# Patient Record
Sex: Male | Born: 1975 | Race: White | Hispanic: No | Marital: Single | Smoking: Never smoker
Health system: Southern US, Community
[De-identification: ages and names within clinical notes are randomized; demographics above are authoritative.]

## PROBLEM LIST (undated history)

## (undated) DIAGNOSIS — I82409 Acute embolism and thrombosis of unspecified deep veins of unspecified lower extremity: Secondary | ICD-10-CM

## (undated) DIAGNOSIS — R569 Unspecified convulsions: Secondary | ICD-10-CM

## (undated) DIAGNOSIS — G40909 Epilepsy, unspecified, not intractable, without status epilepticus: Secondary | ICD-10-CM

## (undated) HISTORY — DX: Epilepsy, unspecified, not intractable, without status epilepticus: G40.909

## (undated) HISTORY — DX: Acute embolism and thrombosis of unspecified deep veins of unspecified lower extremity: I82.409

## (undated) HISTORY — DX: Unspecified convulsions: R56.9

---

## 2021-05-19 ENCOUNTER — Encounter (HOSPITAL_COMMUNITY): Payer: Self-pay | Admitting: Pulmonary Disease

## 2021-05-19 ENCOUNTER — Inpatient Hospital Stay (HOSPITAL_COMMUNITY): Payer: Medicaid - Out of State

## 2021-05-19 ENCOUNTER — Inpatient Hospital Stay (HOSPITAL_COMMUNITY)
Admission: AD | Admit: 2021-05-19 | Discharge: 2021-05-26 | DRG: 175 | Disposition: A | Discharge status: Home Health | Payer: Medicaid - Out of State | Source: Other Acute Inpatient Hospital | Attending: Internal Medicine | Admitting: Internal Medicine

## 2021-05-19 DIAGNOSIS — I2609 Other pulmonary embolism with acute cor pulmonale: Principal | ICD-10-CM | POA: Diagnosis present

## 2021-05-19 DIAGNOSIS — G119 Hereditary ataxia, unspecified: Secondary | ICD-10-CM

## 2021-05-19 DIAGNOSIS — K921 Melena: Secondary | ICD-10-CM | POA: Diagnosis present

## 2021-05-19 DIAGNOSIS — R778 Other specified abnormalities of plasma proteins: Secondary | ICD-10-CM | POA: Diagnosis present

## 2021-05-19 DIAGNOSIS — K922 Gastrointestinal hemorrhage, unspecified: Secondary | ICD-10-CM

## 2021-05-19 DIAGNOSIS — R202 Paresthesia of skin: Secondary | ICD-10-CM

## 2021-05-19 DIAGNOSIS — I10 Essential (primary) hypertension: Secondary | ICD-10-CM | POA: Diagnosis present

## 2021-05-19 DIAGNOSIS — I2699 Other pulmonary embolism without acute cor pulmonale: Principal | ICD-10-CM | POA: Diagnosis present

## 2021-05-19 DIAGNOSIS — D62 Acute posthemorrhagic anemia: Secondary | ICD-10-CM | POA: Diagnosis present

## 2021-05-19 DIAGNOSIS — M109 Gout, unspecified: Secondary | ICD-10-CM | POA: Diagnosis present

## 2021-05-19 DIAGNOSIS — R319 Hematuria, unspecified: Secondary | ICD-10-CM | POA: Diagnosis not present

## 2021-05-19 DIAGNOSIS — Z8739 Personal history of other diseases of the musculoskeletal system and connective tissue: Secondary | ICD-10-CM

## 2021-05-19 DIAGNOSIS — R531 Weakness: Secondary | ICD-10-CM

## 2021-05-19 DIAGNOSIS — Z531 Procedure and treatment not carried out because of patient's decision for reasons of belief and group pressure: Secondary | ICD-10-CM | POA: Diagnosis not present

## 2021-05-19 DIAGNOSIS — I2782 Chronic pulmonary embolism: Secondary | ICD-10-CM | POA: Diagnosis not present

## 2021-05-19 DIAGNOSIS — I70222 Atherosclerosis of native arteries of extremities with rest pain, left leg: Secondary | ICD-10-CM

## 2021-05-19 DIAGNOSIS — I82433 Acute embolism and thrombosis of popliteal vein, bilateral: Secondary | ICD-10-CM | POA: Diagnosis present

## 2021-05-19 DIAGNOSIS — Z91199 Patient's noncompliance with other medical treatment and regimen due to unspecified reason: Secondary | ICD-10-CM | POA: Diagnosis not present

## 2021-05-19 DIAGNOSIS — G40909 Epilepsy, unspecified, not intractable, without status epilepticus: Secondary | ICD-10-CM | POA: Diagnosis present

## 2021-05-19 DIAGNOSIS — M79602 Pain in left arm: Secondary | ICD-10-CM | POA: Diagnosis not present

## 2021-05-19 DIAGNOSIS — J9601 Acute respiratory failure with hypoxia: Secondary | ICD-10-CM | POA: Diagnosis present

## 2021-05-19 DIAGNOSIS — R579 Shock, unspecified: Secondary | ICD-10-CM | POA: Diagnosis present

## 2021-05-19 DIAGNOSIS — R270 Ataxia, unspecified: Secondary | ICD-10-CM | POA: Diagnosis present

## 2021-05-19 DIAGNOSIS — D509 Iron deficiency anemia, unspecified: Secondary | ICD-10-CM | POA: Diagnosis present

## 2021-05-19 DIAGNOSIS — E039 Hypothyroidism, unspecified: Secondary | ICD-10-CM | POA: Diagnosis present

## 2021-05-19 DIAGNOSIS — F1090 Alcohol use, unspecified, uncomplicated: Secondary | ICD-10-CM | POA: Diagnosis present

## 2021-05-19 DIAGNOSIS — E785 Hyperlipidemia, unspecified: Secondary | ICD-10-CM | POA: Diagnosis present

## 2021-05-19 DIAGNOSIS — G8929 Other chronic pain: Secondary | ICD-10-CM | POA: Diagnosis not present

## 2021-05-19 HISTORY — PX: IR IVC FILTER PLMT / S&I /IMG GUID/MOD SED: IMG701

## 2021-05-19 LAB — BASIC METABOLIC PANEL
Anion gap: 9 (ref 5–15)
Anion gap: 6 (ref 5–15)
BUN: 22 mg/dL — ABNORMAL HIGH (ref 6–20)
BUN: 22 mg/dL — ABNORMAL HIGH (ref 6–20)
CO2: 22 mmol/L (ref 22–32)
CO2: 24 mmol/L (ref 22–32)
Calcium: 8.5 mg/dL — ABNORMAL LOW (ref 8.9–10.3)
Calcium: 8.4 mg/dL — ABNORMAL LOW (ref 8.9–10.3)
Chloride: 107 mmol/L (ref 98–111)
Chloride: 109 mmol/L (ref 98–111)
Creatinine, Ser: 1.07 mg/dL (ref 0.61–1.24)
Creatinine, Ser: 1.02 mg/dL (ref 0.61–1.24)
GFR, Estimated: >60 mL/min (ref >60)
GFR, Estimated: >60 mL/min (ref >60)
Glucose, Bld: 112 mg/dL — ABNORMAL HIGH (ref 70–99)
Glucose, Bld: 104 mg/dL — ABNORMAL HIGH (ref 70–99)
Potassium: 3.8 mmol/L (ref 3.5–5.1)
Potassium: 3.5 mmol/L (ref 3.5–5.1)
Sodium: 138 mmol/L (ref 135–145)
Sodium: 139 mmol/L (ref 135–145)

## 2021-05-19 LAB — COMPREHENSIVE METABOLIC PANEL
ALT: 15 U/L (ref 0–44)
AST: 27 U/L (ref 15–41)
Albumin: 3 g/dL — ABNORMAL LOW (ref 3.5–5.0)
Alkaline Phosphatase: 64 U/L (ref 38–126)
Anion gap: 10 (ref 5–15)
BUN: 24 mg/dL — ABNORMAL HIGH (ref 6–20)
CO2: 22 mmol/L (ref 22–32)
Calcium: 8.6 mg/dL — ABNORMAL LOW (ref 8.9–10.3)
Chloride: 108 mmol/L (ref 98–111)
Creatinine, Ser: 1.16 mg/dL (ref 0.61–1.24)
GFR, Estimated: >60 mL/min (ref >60)
Glucose, Bld: 143 mg/dL — ABNORMAL HIGH (ref 70–99)
Potassium: 3.1 mmol/L — ABNORMAL LOW (ref 3.5–5.1)
Sodium: 140 mmol/L (ref 135–145)
Total Bilirubin: 0.6 mg/dL (ref 0.3–1.2)
Total Protein: 6.2 g/dL — ABNORMAL LOW (ref 6.5–8.1)

## 2021-05-19 LAB — CBC WITH DIFFERENTIAL/PLATELET
Abs Immature Granulocytes: 0.03 10*3/uL (ref 0.00–0.07)
Basophils Absolute: 0.1 10*3/uL (ref 0.0–0.1)
Basophils Relative: 1 %
Eosinophils Absolute: 0 10*3/uL (ref 0.0–0.5)
Eosinophils Relative: 0 %
HCT: 24 % — ABNORMAL LOW (ref 39.0–52.0)
Hemoglobin: 7.1 g/dL — ABNORMAL LOW (ref 13.0–17.0)
Immature Granulocytes: 0 %
Lymphocytes Relative: 28 %
Lymphs Abs: 2.8 10*3/uL (ref 0.7–4.0)
MCH: 21.4 pg — ABNORMAL LOW (ref 26.0–34.0)
MCHC: 29.6 g/dL — ABNORMAL LOW (ref 30.0–36.0)
MCV: 72.3 fL — ABNORMAL LOW (ref 80.0–100.0)
Monocytes Absolute: 1.2 10*3/uL — ABNORMAL HIGH (ref 0.1–1.0)
Monocytes Relative: 12 %
Neutro Abs: 6 10*3/uL (ref 1.7–7.7)
Neutrophils Relative %: 59 %
Platelets: 173 10*3/uL (ref 150–400)
RBC: 3.32 MIL/uL — ABNORMAL LOW (ref 4.22–5.81)
RDW: 15.5 % (ref 11.5–15.5)
WBC: 10.1 10*3/uL (ref 4.0–10.5)
nRBC: 0.5 % — ABNORMAL HIGH (ref 0.0–0.2)

## 2021-05-19 LAB — CBC
HCT: 22.5 % — ABNORMAL LOW (ref 39.0–52.0)
Hemoglobin: 6.5 g/dL — CL (ref 13.0–17.0)
MCH: 21 pg — ABNORMAL LOW (ref 26.0–34.0)
MCHC: 28.9 g/dL — ABNORMAL LOW (ref 30.0–36.0)
MCV: 72.6 fL — ABNORMAL LOW (ref 80.0–100.0)
Platelets: 147 10*3/uL — ABNORMAL LOW (ref 150–400)
RBC: 3.1 MIL/uL — ABNORMAL LOW (ref 4.22–5.81)
RDW: 15.8 % — ABNORMAL HIGH (ref 11.5–15.5)
WBC: 8.6 10*3/uL (ref 4.0–10.5)
nRBC: 0.2 % (ref 0.0–0.2)

## 2021-05-19 LAB — MRSA NEXT GEN BY PCR, NASAL: MRSA by PCR Next Gen: NOT DETECTED

## 2021-05-19 LAB — ECHOCARDIOGRAM COMPLETE
AR max vel: 3.31 cm2
AV Area VTI: 3.36 cm2
AV Area mean vel: 3.11 cm2
AV Mean grad: 5 mmHg
AV Peak grad: 8.6 mmHg
Ao pk vel: 1.47 m/s
Area-P 1/2: 3.48 cm2
Calc EF: 67.3 %
S' Lateral: 2.4 cm
Single Plane A2C EF: 57.1 %
Single Plane A4C EF: 74.7 %
Weight: 2881.85 oz

## 2021-05-19 LAB — APTT: aPTT: 78 seconds — ABNORMAL HIGH (ref 24–36)

## 2021-05-19 LAB — IRON AND TIBC
Iron: 14 ug/dL — ABNORMAL LOW (ref 45–182)
Saturation Ratios: 3 % — ABNORMAL LOW (ref 17.9–39.5)
TIBC: 417 ug/dL (ref 250–450)
UIBC: 403 ug/dL

## 2021-05-19 LAB — GLUCOSE, CAPILLARY
Glucose-Capillary: 109 mg/dL — ABNORMAL HIGH (ref 70–99)
Glucose-Capillary: 110 mg/dL — ABNORMAL HIGH (ref 70–99)
Glucose-Capillary: 111 mg/dL — ABNORMAL HIGH (ref 70–99)
Glucose-Capillary: 122 mg/dL — ABNORMAL HIGH (ref 70–99)
Glucose-Capillary: 83 mg/dL (ref 70–99)
Glucose-Capillary: 89 mg/dL (ref 70–99)
Glucose-Capillary: 98 mg/dL (ref 70–99)

## 2021-05-19 LAB — PHOSPHORUS
Phosphorus: 2.2 mg/dL — ABNORMAL LOW (ref 2.5–4.6)
Phosphorus: 2.8 mg/dL (ref 2.5–4.6)

## 2021-05-19 LAB — TROPONIN I (HIGH SENSITIVITY)
Troponin I (High Sensitivity): 109 ng/L (ref <18)
Troponin I (High Sensitivity): 114 ng/L (ref <18)

## 2021-05-19 LAB — NO BLOOD PRODUCTS

## 2021-05-19 LAB — HEMOGLOBIN AND HEMATOCRIT, BLOOD
HCT: 22.3 % — ABNORMAL LOW (ref 39.0–52.0)
Hemoglobin: 6.2 g/dL — CL (ref 13.0–17.0)

## 2021-05-19 LAB — MAGNESIUM
Magnesium: 1.6 mg/dL — ABNORMAL LOW (ref 1.7–2.4)
Magnesium: 1.8 mg/dL (ref 1.7–2.4)

## 2021-05-19 LAB — FOLATE: Folate: 20.6 ng/mL (ref >5.9)

## 2021-05-19 LAB — ABO/RH: ABO/RH(D): O POS

## 2021-05-19 LAB — BRAIN NATRIURETIC PEPTIDE: B Natriuretic Peptide: 1082 pg/mL — ABNORMAL HIGH (ref 0.0–100.0)

## 2021-05-19 LAB — LACTIC ACID, PLASMA
Lactic Acid, Venous: 1.3 mmol/L (ref 0.5–1.9)
Lactic Acid, Venous: 1.1 mmol/L (ref 0.5–1.9)

## 2021-05-19 LAB — HEPARIN LEVEL (UNFRACTIONATED)
Heparin Unfractionated: 0.41 IU/mL (ref 0.30–0.70)
Heparin Unfractionated: 0.62 IU/mL (ref 0.30–0.70)
Heparin Unfractionated: 0.42 IU/mL (ref 0.30–0.70)

## 2021-05-19 LAB — PROTIME-INR
INR: 1.1 (ref 0.8–1.2)
Prothrombin Time: 14.4 seconds (ref 11.4–15.2)

## 2021-05-19 LAB — HIV ANTIBODY (ROUTINE TESTING W REFLEX): HIV Screen 4th Generation wRfx: NONREACTIVE

## 2021-05-19 LAB — FERRITIN: Ferritin: 17 ng/mL — ABNORMAL LOW (ref 24–336)

## 2021-05-19 LAB — VITAMIN B12: Vitamin B-12: 497 pg/mL (ref 180–914)

## 2021-05-19 IMAGING — CT CT ANGIO AOBIFEM WO/W CM
1 of 11 series · 3 of 16 positions shown, 4 images · IV contrast (OMNI 350)
Comparison: None.

CLINICAL DATA: Arterial embolism, lower extremity

EXAM:
CT ANGIOGRAPHY OF ABDOMINAL AORTA WITH ILIOFEMORAL RUNOFF
TECHNIQUE: Multidetector CT imaging of the abdomen, pelvis and lower
extremities was performed using the standard protocol during bolus
administration of intravenous contrast. Multiplanar CT image
reconstructions and MIPs were obtained to evaluate the vascular
anatomy.

[Series 5: cta runoff (id) · axial · 0.98mm/px · z∈[+433,+1171]mm · 3 of 492 slices shown, 4 images]
[im 123/492  soft-tissue]
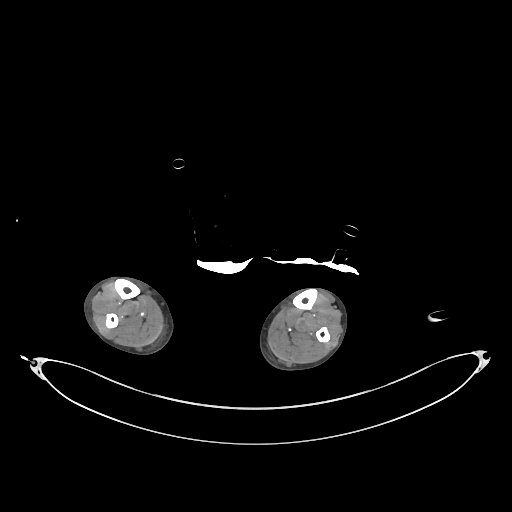
[im 123/492  bone]
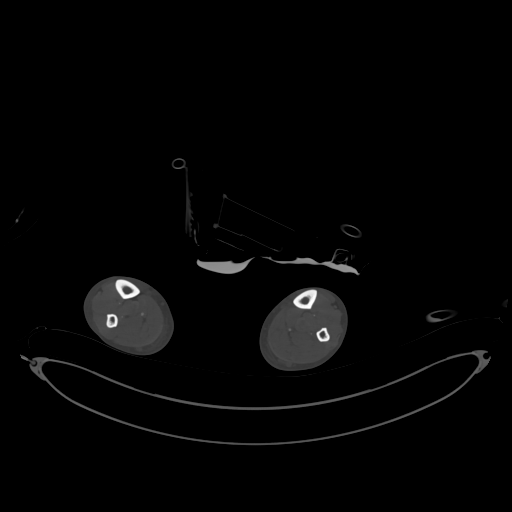
[im 246/492  soft-tissue]
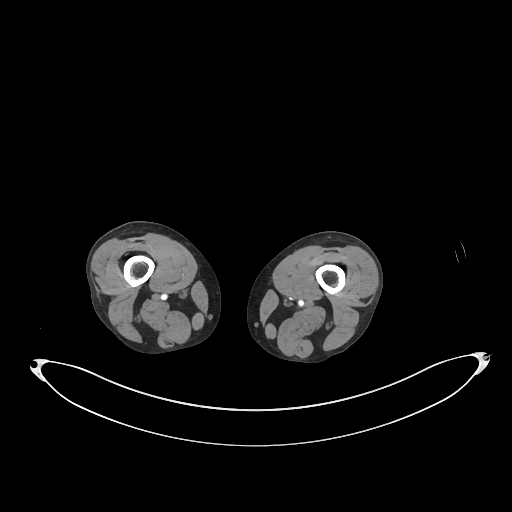
[im 369/492  soft-tissue]
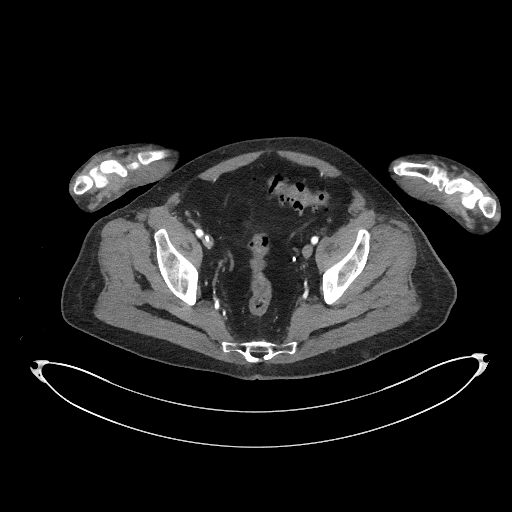

[3 of 16 positions shown; findings below may reference images not displayed]

RADIATION DOSE REDUCTION: This exam was performed according to the
departmental dose-optimization program which includes automated
exposure control, adjustment of the mA and/or kV according to
patient size and/or use of iterative reconstruction technique.

CONTRAST:  100mL OMNIPAQUE IOHEXOL 350 MG/ML SOLN
FINDINGS: VASCULAR

Abdominal Aorta: The abdominal aorta is normal in caliber with
scattered atherosclerotic disease.

Celiac: Patent without significant stenosis.

SMA: Patent without significant stenosis.

IMA: Patent.

Right Renal artery: Patent without significant stenosis.

Left Renal artery: Patent without significant stenosis.

Right lower extremity

Common iliac artery: Patent without significant stenosis or
aneurysm.

Internal iliac artery: Patent.

External iliac artery: Patent without significant stenosis.

Common femoral artery: Patent without significant stenosis.

Profunda femoral artery: There is occlusive thrombus involving a
proximal branch of the profunda artery.

Superficial femoral artery: Patent without significant stenosis.

Popliteal artery: Patent without significant stenosis or aneurysm.

Tibioperoneal trunk: Mild atherosclerotic disease without
significant stenosis.

Runoff vessels: Patent three-vessel runoff to the ankle.

Left lower extremity

Common iliac artery: Patent without significant stenosis or
aneurysm.

Internal iliac artery: Patent.

External iliac artery: Patent without significant stenosis.

Common femoral artery: Patent without significant stenosis.

Profunda femoral artery: There is occlusive thrombus involving a
branch of the profunda artery.

Superficial femoral artery: Patent without significant stenosis.

Popliteal artery: There is occlusive thrombus of the popliteal
artery spanning approximately 9 cm.

Tibioperoneal trunk: Occlusive thrombus.

Runoff vessels: Occluded proximally. There is reconstitution of a
three-vessel runoff in the proximal calf that remains patent to the
level of the ankle.

Veins: Infrarenal IVC filter in place. Right femoral vein central
venous catheter with tip terminating in the right iliac veins.

NON-VASCULAR

Inferior chest: Known bilateral pulmonary embolism.

Hepatobiliary: The liver is normal in size without focal
abnormality. No intrahepatic or extrahepatic biliary ductal
dilation. The gallbladder appears normal.

Spleen: Normal in size without focal abnormality.

Pancreas: No pancreatic ductal dilatation or surrounding
inflammatory changes.

Adrenals/Urinary Tract: Adrenal glands are unremarkable. Kidneys are
normal in size without hydronephrosis. Small right renal cyst. Foley
catheter in place.

Stomach/Bowel: The stomach, small bowel and large bowel are normal
in caliber without abnormal wall thickening or surrounding
inflammatory changes. Sigmoid diverticulosis without acute
inflammatory changes.

Reproductive: Prostate is unremarkable.

Lymphatic: No enlarged lymph nodes in the abdomen or pelvis.

Other: No abdominopelvic ascites.

Musculoskeletal: Healing left posterior tenth rib fracture. Left
ankle internal fixation hardware. The soft tissues are unremarkable.
IMPRESSION: VASCULAR

1. There is acute occlusive thromboembolism involving the left
popliteal artery and proximal runoff vessels (see key images). There
is reconstitution of the three-vessel runoff in the proximal calf
that is patent through the ankle.
2. There is additional acute thrombus involving bilateral profunda
artery branches.
3. Known bilateral pulmonary embolism.
4. IVC filter and left groin central venous catheter in place.

NON-VASCULAR

1. Sigmoid diverticulosis without acute inflammatory changes.
2. Healing left posterior tenth rib fracture.

The critical results were called by telephone at the time of
interpretation on [DATE] at [DATE] to provider JAYLON , who
verbally acknowledged these results.

## 2021-05-19 IMAGING — XA IR IVC FILTER PLMT / S&I /IMG GUID/MOD SED
2 series · 11 of 11 positions shown · IV contrast (IODINE)
Comparison: Chest CTA-earlier same day

INDICATION: DVT and pulmonary embolism with contraindication to anticoagulation
(lower GI bleeding).

Given patient's multiple medical comorbidities the patient will NOT
be actively followed by the [REDACTED] for
retrieval.
EXAM:
ULTRASOUND GUIDANCE FOR VASCULAR ACCESS
IVC CATHETERIZATION AND VENOGRAM
IVC FILTER INSERTION

[Series 1: body 4 care · 3 acquisitions, 6 frames shown (1 of 2)]
[im 1/3]
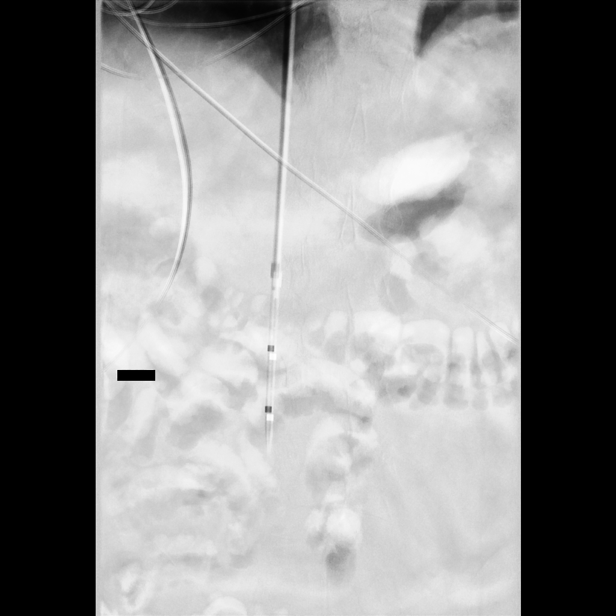
[im 1/3]
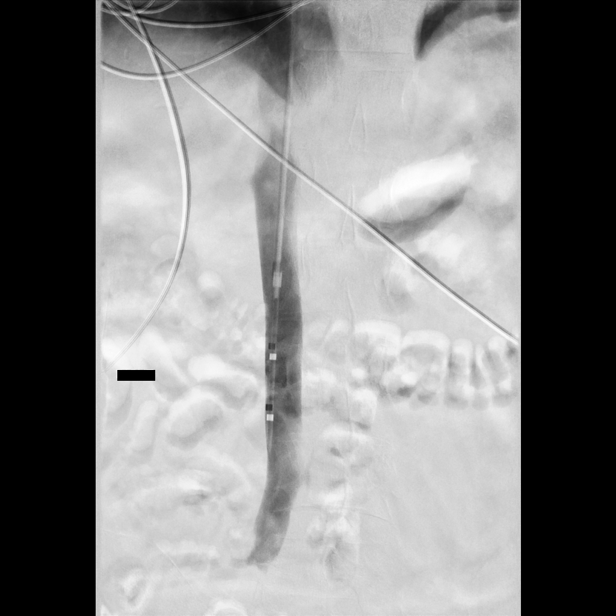
[im 1/3]
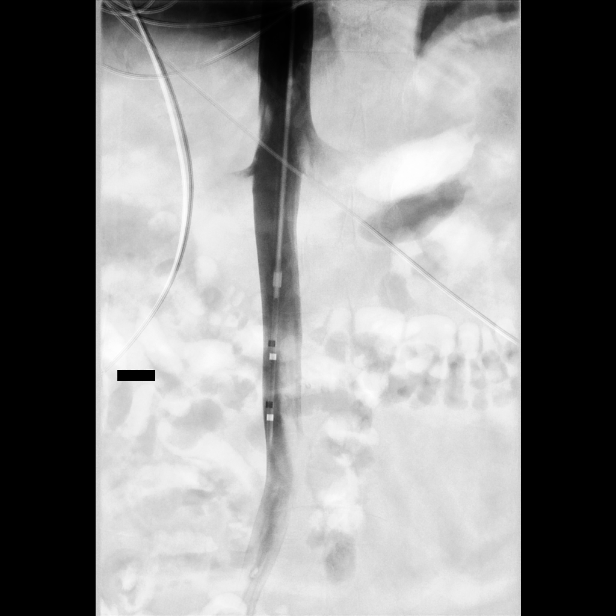
[im 1/3]
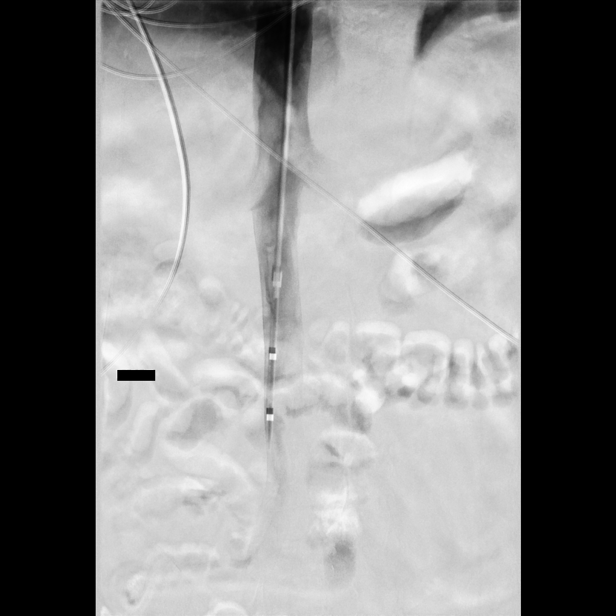
[im 2/3]
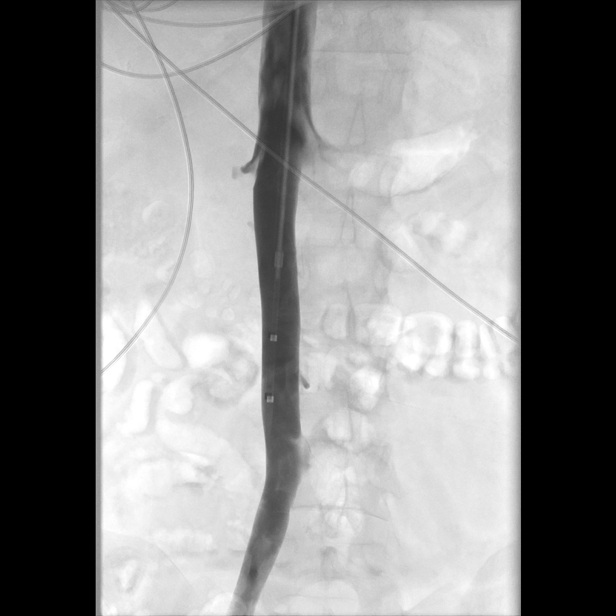
[im 3/3]
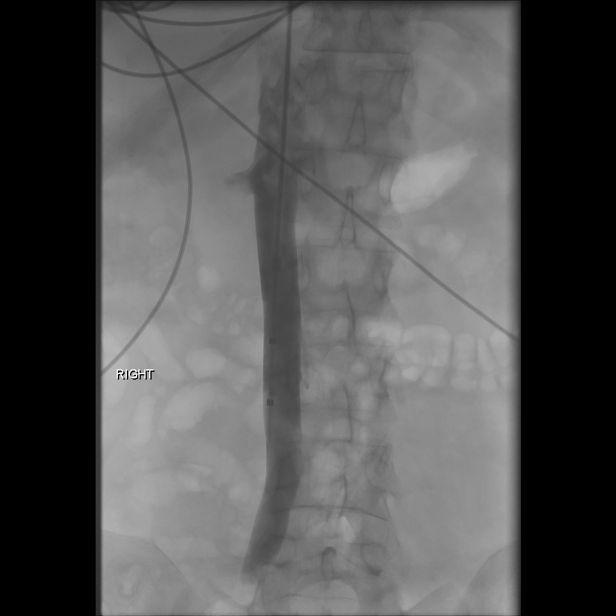

[Series 2: body 4 care · 2 acquisitions, 5 frames shown (2 of 2)]
[im 1/2]
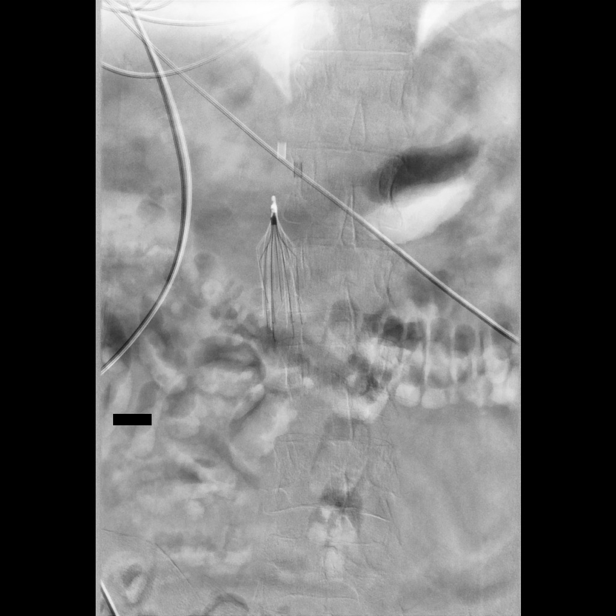
[im 1/2]
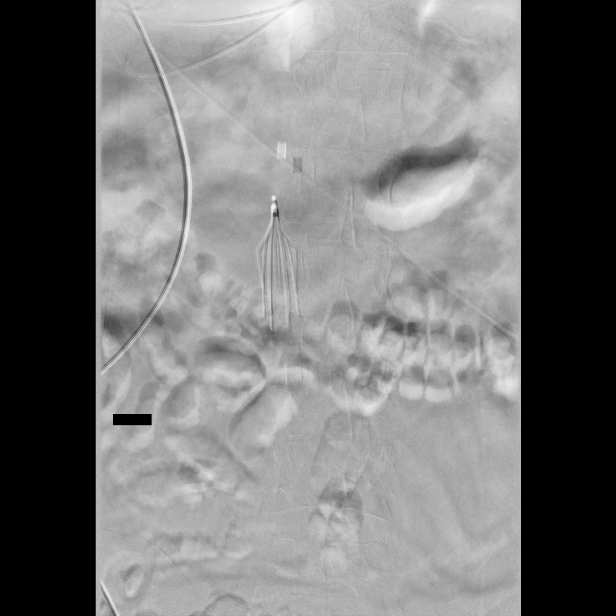
[im 1/2]
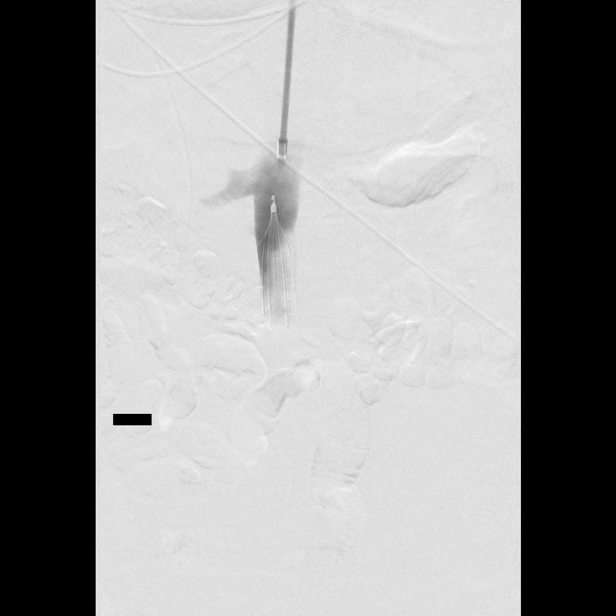
[im 1/2]
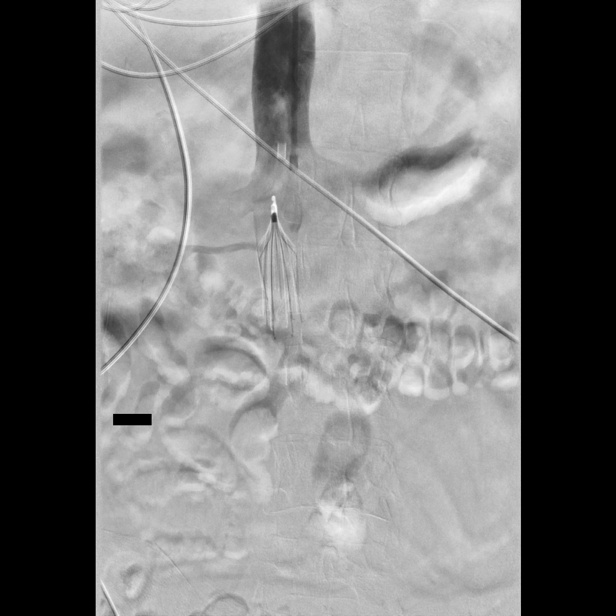
[im 2/2]
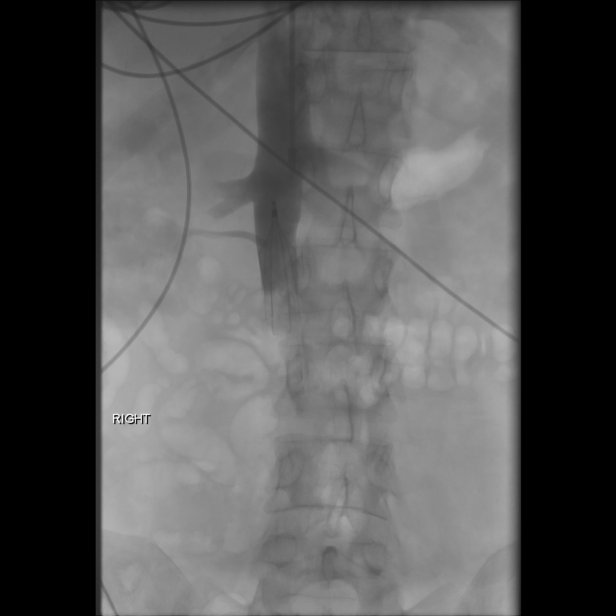

[11 of 11 positions shown; findings below may reference images not displayed]

MEDICATIONS:
None.

ANESTHESIA/SEDATION:
Moderate (conscious) sedation was employed during this procedure as
administered by the [REDACTED].

A total of Versed 1 mg and Fentanyl 25 mcg was administered
intravenously.

Moderate Sedation Time: 11 minutes. The patient's level of
consciousness and vital signs were monitored continuously by
radiology nursing throughout the procedure under my direct
supervision.

CONTRAST:  45 mL OMNIPAQUE IOHEXOL 300 MG/ML  SOLN

FLUOROSCOPY TIME:  48 seconds (32 mGy)

COMPLICATIONS:
None immediate

PROCEDURE:
Informed consent was obtained from the patient following explanation
of the procedure, risks, benefits and alternatives. The patient
understands, agrees and consents for the procedure. All questions
were addressed. A time out was performed prior to the initiation of
the procedure.

Maximal barrier sterile technique utilized including caps, mask,
sterile gowns, sterile gloves, large sterile drape, hand hygiene,
and Betadine prep.

Under sterile condition and local anesthesia, right internal jugular
venous access was performed with ultrasound. An ultrasound image was
saved and sent to PACS. Over a guidewire, the IVC filter delivery
sheath and inner dilator were advanced into the IVC just above the
IVC bifurcation. Contrast injection was performed for an IVC
venogram.

Through the delivery sheath, a retrievable Denali IVC filter was
deployed below the level of the renal veins and above the IVC
bifurcation. Limited post deployment venacavagram was performed.

The delivery sheath was removed and hemostasis was obtained with
manual compression. A dressing was placed. The patient tolerated the
procedure well without immediate post procedural complication.
FINDINGS: The IVC is patent. No evidence of thrombus, stenosis, or occlusion.
No variant venous anatomy. Successful placement of the IVC filter
below the level of the renal veins.
IMPRESSION: Successful ultrasound and fluoroscopically guided placement of an
infrarenal retrievable IVC filter via right jugular approach.

PLAN:
Due to patient related comorbidities and/or clinical necessity, this
IVC filter should be considered a permanent device. This patient
will not be actively followed for future filter retrieval.

## 2021-05-19 MED ORDER — MIDAZOLAM HCL 2 MG/2ML IJ SOLN
INTRAMUSCULAR | Status: AC
  Filled 2021-05-19: qty 2

## 2021-05-19 MED ORDER — SODIUM CHLORIDE 0.9 % IV SOLN
250.0000 mL | INTRAVENOUS | Status: DC
  Administered 2021-05-19 – 2021-05-20 (×2): 250 mL via INTRAVENOUS

## 2021-05-19 MED ORDER — SODIUM CHLORIDE 0.9 % IV SOLN
1.0000 mg | Freq: Once | INTRAVENOUS | Status: AC
  Administered 2021-05-19: 1 mg via INTRAVENOUS
  Filled 2021-05-19: qty 0.2

## 2021-05-19 MED ORDER — PANTOPRAZOLE SODIUM 40 MG PO TBEC: 40.0000 mg | DELAYED_RELEASE_TABLET | Freq: Every day | ORAL | Status: DC

## 2021-05-19 MED ORDER — NOREPINEPHRINE 16 MG/250ML-% IV SOLN
0.0000 ug/min | INTRAVENOUS | Status: DC
  Administered 2021-05-19: 3 ug/min via INTRAVENOUS
  Filled 2021-05-19: qty 250

## 2021-05-19 MED ORDER — POTASSIUM CHLORIDE 10 MEQ/50ML IV SOLN
10.0000 meq | INTRAVENOUS | Status: AC
  Administered 2021-05-19 (×3): 10 meq via INTRAVENOUS
  Filled 2021-05-19 (×3): qty 50

## 2021-05-19 MED ORDER — CYANOCOBALAMIN 1000 MCG/ML IJ SOLN
1000.0000 ug | Freq: Once | INTRAMUSCULAR | Status: AC
  Administered 2021-05-19: 1000 ug via INTRAMUSCULAR
  Filled 2021-05-19: qty 1

## 2021-05-19 MED ORDER — PANTOPRAZOLE SODIUM 40 MG IV SOLR
40.0000 mg | Freq: Two times a day (BID) | INTRAVENOUS | Status: DC
  Administered 2021-05-19 – 2021-05-20 (×3): 40 mg via INTRAVENOUS
  Filled 2021-05-19 (×3): qty 10

## 2021-05-19 MED ORDER — "THROMBI-PAD 3""X3"" EX PADS"
1.0000 | MEDICATED_PAD | Freq: Once | CUTANEOUS | Status: AC
  Administered 2021-05-19: 1 via TOPICAL
  Filled 2021-05-19: qty 1

## 2021-05-19 MED ORDER — LIDOCAINE HCL 1 % IJ SOLN
INTRAMUSCULAR | Status: AC
  Filled 2021-05-19: qty 20

## 2021-05-19 MED ORDER — MIRTAZAPINE 15 MG PO TABS: 15.0000 mg | ORAL_TABLET | Freq: Every day | ORAL | Status: DC

## 2021-05-19 MED ORDER — OXCARBAZEPINE 300 MG PO TABS
600.0000 mg | ORAL_TABLET | Freq: Two times a day (BID) | ORAL | Status: DC
  Administered 2021-05-19 – 2021-05-20 (×4): 600 mg via ORAL
  Filled 2021-05-19 (×4): qty 2

## 2021-05-19 MED ORDER — ALLOPURINOL 300 MG PO TABS
300.0000 mg | ORAL_TABLET | Freq: Every day | ORAL | Status: DC
  Administered 2021-05-20: 300 mg via ORAL
  Filled 2021-05-19: qty 1

## 2021-05-19 MED ORDER — PHENYLEPHRINE CONCENTRATED 100MG/250ML (0.4 MG/ML) INFUSION SIMPLE
25.0000 ug/min | INTRAVENOUS | Status: DC
  Filled 2021-05-19: qty 250

## 2021-05-19 MED ORDER — DOCUSATE SODIUM 100 MG PO CAPS: 100.0000 mg | ORAL_CAPSULE | Freq: Two times a day (BID) | ORAL | Status: DC | PRN

## 2021-05-19 MED ORDER — SODIUM CHLORIDE 0.9 % IV SOLN
1000.0000 mg | Freq: Once | INTRAVENOUS | Status: AC
  Administered 2021-05-19: 1000 mg via INTRAVENOUS
  Filled 2021-05-19: qty 20

## 2021-05-19 MED ORDER — ASCORBIC ACID 500 MG PO TABS
500.0000 mg | ORAL_TABLET | Freq: Every day | ORAL | Status: DC
  Administered 2021-05-19 – 2021-05-26 (×8): 500 mg via ORAL
  Filled 2021-05-19 (×9): qty 1

## 2021-05-19 MED ORDER — HEPARIN (PORCINE) 25000 UT/250ML-% IV SOLN
1150.0000 [IU]/h | INTRAVENOUS | Status: AC
  Administered 2021-05-19: 1500 [IU]/h via INTRAVENOUS
  Administered 2021-05-19: 1350 [IU]/h via INTRAVENOUS
  Administered 2021-05-20 – 2021-05-21 (×2): 1150 [IU]/h via INTRAVENOUS
  Filled 2021-05-19 (×4): qty 250

## 2021-05-19 MED ORDER — NOREPINEPHRINE 4 MG/250ML-% IV SOLN
INTRAVENOUS | Status: AC
  Administered 2021-05-19: 20 ug/min
  Filled 2021-05-19: qty 250

## 2021-05-19 MED ORDER — MIDAZOLAM HCL 2 MG/2ML IJ SOLN
INTRAMUSCULAR | Status: AC | PRN
Start: 2021-05-19 — End: 2021-05-19
  Administered 2021-05-19: 1 mg via INTRAVENOUS

## 2021-05-19 MED ORDER — ORAL CARE MOUTH RINSE
15.0000 mL | Freq: Two times a day (BID) | OROMUCOSAL | Status: DC
  Administered 2021-05-19 – 2021-05-26 (×13): 15 mL via OROMUCOSAL

## 2021-05-19 MED ORDER — IOHEXOL 300 MG/ML  SOLN
100.0000 mL | Freq: Once | INTRAMUSCULAR | Status: AC | PRN
  Administered 2021-05-19: 45 mL via INTRAVENOUS

## 2021-05-19 MED ORDER — POTASSIUM CHLORIDE 10 MEQ/50ML IV SOLN
10.0000 meq | Freq: Once | INTRAVENOUS | Status: AC
  Administered 2021-05-19: 10 meq via INTRAVENOUS
  Filled 2021-05-19: qty 50

## 2021-05-19 MED ORDER — SODIUM CHLORIDE 0.9 % IV SOLN
25.0000 mg | Freq: Once | INTRAVENOUS | Status: AC
  Administered 2021-05-19: 25 mg via INTRAVENOUS
  Filled 2021-05-19: qty 0.5

## 2021-05-19 MED ORDER — SODIUM CHLORIDE 0.9% IV SOLUTION: Freq: Once | INTRAVENOUS | Status: DC

## 2021-05-19 MED ORDER — MAGNESIUM SULFATE 4 GM/100ML IV SOLN
4.0000 g | Freq: Once | INTRAVENOUS | Status: AC
  Administered 2021-05-19: 4 g via INTRAVENOUS
  Filled 2021-05-19: qty 100

## 2021-05-19 MED ORDER — DARBEPOETIN ALFA 150 MCG/0.3ML IJ SOSY
150.0000 ug | PREFILLED_SYRINGE | Freq: Once | INTRAMUSCULAR | Status: DC
  Filled 2021-05-19: qty 0.3

## 2021-05-19 MED ORDER — FENTANYL CITRATE (PF) 100 MCG/2ML IJ SOLN
INTRAMUSCULAR | Status: AC | PRN
  Administered 2021-05-19: 25 ug via INTRAVENOUS

## 2021-05-19 MED ORDER — POLYETHYLENE GLYCOL 3350 17 G PO PACK: 17.0000 g | PACK | Freq: Every day | ORAL | Status: DC | PRN

## 2021-05-19 MED ORDER — CHLORHEXIDINE GLUCONATE CLOTH 2 % EX PADS
6.0000 | MEDICATED_PAD | Freq: Every day | CUTANEOUS | Status: DC
  Administered 2021-05-19 – 2021-05-25 (×4): 6 via TOPICAL

## 2021-05-19 MED ORDER — IOHEXOL 350 MG/ML SOLN
100.0000 mL | Freq: Once | INTRAVENOUS | Status: AC | PRN
  Administered 2021-05-19: 100 mL via INTRAVENOUS

## 2021-05-19 MED ORDER — POTASSIUM PHOSPHATES 15 MMOLE/5ML IV SOLN
15.0000 mmol | Freq: Once | INTRAVENOUS | Status: AC
  Administered 2021-05-19: 15 mmol via INTRAVENOUS
  Filled 2021-05-19: qty 5

## 2021-05-19 MED ORDER — DARBEPOETIN ALFA 150 MCG/0.3ML IJ SOSY
150.0000 ug | PREFILLED_SYRINGE | Freq: Once | INTRAMUSCULAR | Status: AC
  Administered 2021-05-19: 150 ug via SUBCUTANEOUS
  Filled 2021-05-19: qty 0.3

## 2021-05-19 MED ORDER — ATORVASTATIN CALCIUM 10 MG PO TABS
10.0000 mg | ORAL_TABLET | Freq: Every day | ORAL | Status: DC
  Administered 2021-05-20 – 2021-05-26 (×7): 10 mg via ORAL
  Filled 2021-05-19 (×7): qty 1

## 2021-05-19 MED ORDER — FENTANYL CITRATE (PF) 100 MCG/2ML IJ SOLN
INTRAMUSCULAR | Status: AC
  Filled 2021-05-19: qty 2

## 2021-05-19 NOTE — Progress Notes (Signed)
ANTICOAGULATION CONSULT NOTE - Follow-up Consult ? ?Pharmacy Consult for Heparin  ?Indication: pulmonary embolus ? ?Allergies  ?Allergen Reactions  ? Whole Blood   ?  Jehovah's - declines blood or blood products, will accept fractions such as albumin ,hemoglobin  ? ? ? ?Vital Signs: ?Temp: 98 ?F (36.7 ?C) (03/24 1131) ?Temp Source: Oral (03/24 1131) ?BP: 137/97 (03/24 1200) ?Pulse Rate: 111 (03/24 1200) ? ?Labs: ?Recent Labs  ?  05/19/21 ?0124 05/19/21 ?0309 05/19/21 ?0451 05/19/21 ?1046  ?HGB 7.1*  --  6.5*  --   ?HCT 24.0*  --  22.5*  --   ?PLT 173  --  147*  --   ?APTT 78*  --   --   --   ?LABPROT 14.4  --   --   --   ?INR 1.1  --   --   --   ?HEPARINUNFRC 0.41  --   --  0.62  ?CREATININE 1.16 1.07 1.02  --   ?TROPONINIHS 109* 114*  --   --   ? ? ?CrCl cannot be calculated (Unknown ideal weight.). ? ? ?Medical History: ?No past medical history on file. ? ?Assessment: ?46 y/o M transfer from outside facility with new onset pulmonary embolus and DVT as well as aterial   ? ?Noted low Hgb of 6.5 with melena and refuses blood products. Ok for iron and Aranesp. Discussed continuation with Dr. Vassie Loll, CCM - benefit outweighs risk at present and continuing therapy to target low goal.  ? ?Heparin level 0.62 on current rate of 1500 units/hr -- targeting low goal of 0.3 to 0.5 so will reduce. Discussed bleeding with Dr. Vassie Loll and due to extent of clotting, continuing Heparin for now.  ? ?Goal of Therapy:  ?Heparin level 0.3 to 0.5 units/ml ?Monitor platelets by anticoagulation protocol: Yes ?  ?Plan:  ?Decrease Heparin infusion to 1350 units/hr ?Recheck Heparin level in ~6 hours  ?Monitor CBC, clinical status, and degree of bleeding closely ? ?Link Snuffer, PharmD, BCPS, BCCCP ?Clinical Pharmacist ?Please refer to [facility] for [facility] numbers ?05/19/2021, 12:34 PM ? ? ? ?

## 2021-05-19 NOTE — Progress Notes (Signed)
Lower extremity venous bilateral and lower extremity arterial left studies performed. ? ?Preliminary results relayed to Vassie Loll, MD on the unit.  ? ?See CV Proc for preliminary results report.  ? ?Jean Rosenthal, RDMS, RVT ? ?

## 2021-05-19 NOTE — Progress Notes (Signed)
Pt. expressed wishes to NOT receive any blood products due to being a Jehovah's Witness.  ?

## 2021-05-19 NOTE — Progress Notes (Signed)
? ?  Echocardiogram ?2D Echocardiogram has been performed. ? ?Kenneth Cross ?05/19/2021, 10:55 AM ?

## 2021-05-19 NOTE — Procedures (Signed)
Pre procedural Dx: DVT and PE with contraindication to anticoagulation ?Post Procedural Dx: Same ? ?Successful placement of an infrarenal IVC filter via the right internal jugular vein. ? ?Given patient's multiple medical co-morbidities, the patient will NOT be actively followed by the IR service for IVC retrieval.  ? ?EBL: Trace ?Complications: None immediate ? ?Ronny Bacon, MD ?Pager #: [phone] ? ? ? ? ?

## 2021-05-19 NOTE — Consult Note (Signed)
UNASSIGNED CONSULT ? ?Reason for Consult: anemia and hematochezia ?Referring Physician: CCM ? 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  The patient was able to be weaned down overnight from 7 liters of oxygen down to 4 liters.  He was noted to have a decline in his HGB with the initiation of heparin.  His HGB upon the transfer was at 7.1 g/dL and now it is at 6.5 g/dL.  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  One week ago he had a colonoscopy with a local GI and he was identified to have diverticula and a polyp in the rectum.  He was having hematochezia before the onset of his PE, and this was the reason for his colonoscopy.  He had a couple of episodes of bleeding that were significant.  One time he soaked through his clothes and he needed to use a pad for three days.  At the completion of the colonoscopy, the conclusion was that he had hemorrhoidal bleeding.  The patient as encouraged to be on a high fiber diet. ? ?No past medical history on file. ? ?No family history on file. ? ?Social History:  has no history on file for tobacco use, alcohol use, and drug use. ? ?Allergies:  ?Allergies  ?Allergen Reactions  ? Whole Blood   ?  Jehovah's - declines blood or blood products, will accept fractions such as albumin ,hemoglobin  ? ? ?Medications: I have reviewed the patient's current medications. ? ?Results for orders placed or performed during the hospital encounter of 05/19/21 (from the past 24 hour(s))  ?Glucose, capillary     Status: Abnormal  ? Collection Time: 05/19/21 12:33 AM  ?Result Value Ref Range  ? Glucose-Capillary 109 (H) 70 - 99 mg/dL  ?MRSA Next Gen by PCR, Nasal     Status: None  ? Collection Time: 05/19/21 12:44 AM  ? Specimen: Nasal Mucosa; Nasal Swab  ?Result Value Ref Range  ? MRSA by PCR Next Gen  NOT DETECTED NOT DETECTED  ?Lactic acid, plasma     Status: None  ? Collection Time: 05/19/21  1:09 AM  ?Result Value Ref Range  ? Lactic Acid, Venous 1.3 0.5 - 1.9 mmol/L  ?HIV Antibody (routine testing w rflx)     Status: None  ? Collection Time: 05/19/21  1:24 AM  ?Result Value Ref Range  ? HIV Screen 4th Generation wRfx Non Reactive Non Reactive  ?Comprehensive metabolic panel     Status: Abnormal  ? Collection Time: 05/19/21  1:24 AM  ?Result Value Ref Range  ? Sodium 140 135 - 145 mmol/L  ? Potassium 3.1 (L) 3.5 - 5.1 mmol/L  ? Chloride 108 98 - 111 mmol/L  ? CO2 22 22 - 32 mmol/L  ? Glucose, Bld 143 (H) 70 - 99 mg/dL  ? BUN 24 (H) 6 - 20 mg/dL  ? Creatinine, Ser 1.16 0.61 - 1.24 mg/dL  ? Calcium 8.6 (L) 8.9 - 10.3 mg/dL  ? Total Protein 6.2 (L) 6.5 - 8.1 g/dL  ? Albumin 3.0 (L) 3.5 - 5.0 g/dL  ? AST 27 15 - 41 U/L  ? ALT 15 0 - 44 U/L  ? Alkaline Phosphatase 64 38 - 126 U/L  ? Total Bilirubin 0.6 0.3 - 1.2 mg/dL  ? GFR, Estimated >60 >60 mL/min  ? Anion gap  10 5 - 15  ?Magnesium     Status: Abnormal  ? Collection Time: 05/19/21  1:24 AM  ?Result Value Ref Range  ? Magnesium 1.6 (L) 1.7 - 2.4 mg/dL  ?Phosphorus     Status: Abnormal  ? Collection Time: 05/19/21  1:24 AM  ?Result Value Ref Range  ? Phosphorus 2.2 (L) 2.5 - 4.6 mg/dL  ?Brain natriuretic peptide     Status: Abnormal  ? Collection Time: 05/19/21  1:24 AM  ?Result Value Ref Range  ? B Natriuretic Peptide 1,082.0 (H) 0.0 - 100.0 pg/mL  ?Troponin I (High Sensitivity)     Status: Abnormal  ? Collection Time: 05/19/21  1:24 AM  ?Result Value Ref Range  ? Troponin I (High Sensitivity) 109 (HH) <18 ng/L  ?CBC WITH DIFFERENTIAL     Status: Abnormal  ? Collection Time: 05/19/21  1:24 AM  ?Result Value Ref Range  ? WBC 10.1 4.0 - 10.5 K/uL  ? RBC 3.32 (L) 4.22 - 5.81 MIL/uL  ? Hemoglobin 7.1 (L) 13.0 - 17.0 g/dL  ? HCT 24.0 (L) 39.0 - 52.0 %  ? MCV 72.3 (L) 80.0 - 100.0 fL  ? MCH 21.4 (L) 26.0 - 34.0 pg  ? MCHC 29.6 (L) 30.0 - 36.0 g/dL  ? RDW 15.5 11.5 -  15.5 %  ? Platelets 173 150 - 400 K/uL  ? nRBC 0.5 (H) 0.0 - 0.2 %  ? Neutrophils Relative % 59 %  ? Neutro Abs 6.0 1.7 - 7.7 K/uL  ? Lymphocytes Relative 28 %  ? Lymphs Abs 2.8 0.7 - 4.0 K/uL  ? Monocytes Relative 12 %  ? Monocytes Absolute 1.2 (H) 0.1 - 1.0 K/uL  ? Eosinophils Relative 0 %  ? Eosinophils Absolute 0.0 0.0 - 0.5 K/uL  ? Basophils Relative 1 %  ? Basophils Absolute 0.1 0.0 - 0.1 K/uL  ? Immature Granulocytes 0 %  ? Abs Immature Granulocytes 0.03 0.00 - 0.07 K/uL  ?Protime-INR     Status: None  ? Collection Time: 05/19/21  1:24 AM  ?Result Value Ref Range  ? Prothrombin Time 14.4 11.4 - 15.2 seconds  ? INR 1.1 0.8 - 1.2  ?APTT     Status: Abnormal  ? Collection Time: 05/19/21  1:24 AM  ?Result Value Ref Range  ? aPTT 78 (H) 24 - 36 seconds  ?Heparin level (unfractionated)     Status: None  ? Collection Time: 05/19/21  1:24 AM  ?Result Value Ref Range  ? Heparin Unfractionated 0.41 0.30 - 0.70 IU/mL  ?ABO/Rh     Status: None  ? Collection Time: 05/19/21  2:18 AM  ?Result Value Ref Range  ? ABO/RH(D)    ?  O POS ?Performed at [facility], [place] [address] [place],  [address] ?  ?Basic metabolic panel     Status: Abnormal  ? Collection Time: 05/19/21  3:09 AM  ?Result Value Ref Range  ? Sodium 138 135 - 145 mmol/L  ? Potassium 3.8 3.5 - 5.1 mmol/L  ? Chloride 107 98 - 111 mmol/L  ? CO2 22 22 - 32 mmol/L  ? Glucose, Bld 112 (H) 70 - 99 mg/dL  ? BUN 22 (H) 6 - 20 mg/dL  ? Creatinine, Ser 1.07 0.61 - 1.24 mg/dL  ? Calcium 8.5 (L) 8.9 - 10.3 mg/dL  ? GFR, Estimated >60 >60 mL/min  ? Anion gap 9 5 - 15  ?Magnesium     Status: None  ? Collection Time: 05/19/21  3:09 AM  ?Result Value Ref Range  ? Magnesium 1.8 1.7 - 2.4 mg/dL  ?Phosphorus     Status: None  ? Collection Time: 05/19/21  3:09 AM  ?Result Value Ref Range  ? Phosphorus 2.8 2.5 - 4.6 mg/dL  ?Troponin I (High Sensitivity)     Status: Abnormal  ? Collection Time: 05/19/21  3:09 AM  ?Result Value Ref Range  ? Troponin I (High Sensitivity)  114 (HH) <18 ng/L  ?Glucose, capillary     Status: Abnormal  ? Collection Time: 05/19/21  3:19 AM  ?Result Value Ref Range  ? Glucose-Capillary 111 (H) 70 - 99 mg/dL  ?Lactic acid, plasma     Status: None  ? Collection Time: 05/19/21  4:29 AM  ?Result Value Ref Range  ? Lactic Acid, Venous 1.1 0.5 - 1.9 mmol/L  ?CBC     Status: Abnormal  ? Collection Time: 05/19/21  4:51 AM  ?Result Value Ref Range  ? WBC 8.6 4.0 - 10.5 K/uL  ? RBC 3.10 (L) 4.22 - 5.81 MIL/uL  ? Hemoglobin 6.5 (LL) 13.0 - 17.0 g/dL  ? HCT 22.5 (L) 39.0 - 52.0 %  ? MCV 72.6 (L) 80.0 - 100.0 fL  ? MCH 21.0 (L) 26.0 - 34.0 pg  ? MCHC 28.9 (L) 30.0 - 36.0 g/dL  ? RDW 15.8 (H) 11.5 - 15.5 %  ? Platelets 147 (L) 150 - 400 K/uL  ? nRBC 0.2 0.0 - 0.2 %  ?Basic metabolic panel     Status: Abnormal  ? Collection Time: 05/19/21  4:51 AM  ?Result Value Ref Range  ? Sodium 139 135 - 145 mmol/L  ? Potassium 3.5 3.5 - 5.1 mmol/L  ? Chloride 109 98 - 111 mmol/L  ? CO2 24 22 - 32 mmol/L  ? Glucose, Bld 104 (H) 70 - 99 mg/dL  ? BUN 22 (H) 6 - 20 mg/dL  ? Creatinine, Ser 1.02 0.61 - 1.24 mg/dL  ? Calcium 8.4 (L) 8.9 - 10.3 mg/dL  ? GFR, Estimated >60 >60 mL/min  ? Anion gap 6 5 - 15  ?Glucose, capillary     Status: Abnormal  ? Collection Time: 05/19/21  7:30 AM  ?Result Value Ref Range  ? Glucose-Capillary 110 (H) 70 - 99 mg/dL  ?No blood products     Status: None  ? Collection Time: 05/19/21  8:00 AM  ?Result Value Ref Range  ? Transfuse no blood products    ?  TRANSFUSE NO BLOOD PRODUCTS, VERIFIED BY RN RACHEL JAMES ON 05/19/2020 AT 0800 ?Performed at [facility], [place] [address], [place],  [address] ?  ?Heparin level (unfractionated)     Status: None  ? Collection Time: 05/19/21 10:46 AM  ?Result Value Ref Range  ? Heparin Unfractionated 0.62 0.30 - 0.70 IU/mL  ?Glucose, capillary     Status: None  ? Collection Time: 05/19/21 11:27 AM  ?Result Value Ref Range  ? Glucose-Capillary 98 70 - 99 mg/dL  ?  ? ?VAS [place] LOWER EXTREMITY ARTERIAL DUPLEX ? ?Result  Date: 05/19/2021 ?LOWER EXTREMITY ARTERIAL DUPLEX STUDY Patient Name:  Kenneth Cross  Date of Exam:   05/19/2021 Medical Rec #: [id number]            Accession #:    [id number] Date of Birth: [birth year]

## 2021-05-19 NOTE — TOC Progression Note (Signed)
Transition of Care (TOC) - Initial/Assessment Note  ? ? ?Patient Details  ?Name: Kenneth Cross ?MRN: [id number] ?Date of Birth: April 20, 1975 ? ?Transition of Care (TOC) CM/SW Contact:    ?Catalina Pizza Willean Schurman, LCSWA ?Phone Number: ?05/19/2021, 4:28 PM ? ?Clinical Narrative:                 ? ?Transition of Care Department [facility]) has reviewed patient and no TOC needs have been identified at this time. We will continue to monitor patient advancement through interdisciplinary progression rounds. If new patient transition needs arise, please place a TOC consult. ?  ? ?  ?  ? ? ?Patient Goals and CMS Choice ?  ?  ?  ? ?Expected Discharge Plan and Services ?  ?  ?  ?  ?  ?                ?  ?  ?  ?  ?  ?  ?  ?  ?  ?  ? ?Prior Living Arrangements/Services ?  ?  ?  ?       ?  ?  ?  ?  ? ?Activities of Daily Living ?  ?  ? ?Permission Sought/Granted ?  ?  ?   ?   ?   ?   ? ?Emotional Assessment ?  ?  ?  ?  ?  ?  ? ?Admission diagnosis:  Pulmonary embolism (HCC) [I26.99] ?Patient Active Problem List  ? Diagnosis Date Noted  ? Pulmonary embolism (HCC) 05/19/2021  ? Acute deep vein thrombosis (DVT) of popliteal vein of both lower extremities (HCC)   ? LGI bleed   ? ?PCP:  Catha Nottingham, FNP ?Pharmacy:  No Pharmacies Listed ? ? ? ?Social Determinants of Health (SDOH) Interventions ?  ? ?Readmission Risk Interventions ?   ? View : No data to display.  ?  ?  ?  ? ? ? ?
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
## 2021-05-19 NOTE — Progress Notes (Signed)
eLink Physician-Brief Progress Note ?Patient Name: Kenneth Cross ?DOB: 1975-05-12 ?MRN: [id number] ? ? ?Date of Service ? 05/19/2021  ?HPI/Events of Note ? 45/M with history of cerebellar defeneration, basically homebound, presenting with shortness of breath, subsequently diagnosed with PE. He was started on O2, anticoagulation, and transferred to this facility for possible intervention.   ?eICU Interventions ? PE ?-PT with bilateral distal PE on report, with associated R heart strain and mild troponin elevation ?- Will continue O2 support. Titrate to target SPO2 >90% ?- Continue heparin infusion, will titrate as per pharmacy. Monitor for bleeding, thrombocytopenia.  ?- Follow up echo, dopplers. ?- IR evaluation for possible intervention. Will follow recommendations.  ? ?2. Shock ?- May be due to above. Levophed being titrated off.  ? ?3. Anemia ?- Hgb borderline low at 7.1 ?- Offered pRBC transfusion due to shock and high risk of bleed, but pt had declined.   ? ? ? ?  ? ?Matthewjames Petrasek M DELA CRUZ ?05/19/2021, 2:17 AM ?

## 2021-05-19 NOTE — Consult Note (Signed)
? ?Chief Complaint: ?Patient was seen in consultation today for deep vein thrombosis, pulmonary embolism ? ?Referring Physician(s): ?Dr. Cyril Mourningakesh Alva ? ?Supervising Physician: Simonne ComeWatts, John ? ?[facility] ? ?History of Present Illness: ?Kenneth Cross is a 46 y.o. male with history of cerebellar degeneration, GI bleeding, and hypertension who uses a wheel chair with limited ambulation as able due to balance issues.  Patient is also a Jehovah's witness. On 3/19 he developed lower extremity cramping followed by shortness of breath.  He was unable to participate in physical therapy several days later due to the worsening shortness of breath and [place] in [place].  CT imaging showed bilateral pulmonary emboli with evidence of right heart strain.  Troponins were also positive. He was started on heparin drip and [facility].  ?Upon arrival patient was mildly tachycardic with shortness of breath requiring 4L .  HgB 6.5. ECHO performed and is pending.  LE Doppler show  positive DVT. IR consulted for thrombolysis vs. Thrombectomy vs. IVC filter.  ? ?History reviewed. No pertinent past medical history. ? ?Allergies: ?Whole blood ? ?Medications: ?Prior to Admission medications   ?Not on File  ?  ? ?History reviewed. No pertinent family history. ? ?Social History  ? ?Socioeconomic History  ? Marital status: Single  ?  Spouse name: Not on file  ? Number of children: Not on file  ? Years of education: Not on file  ? Highest education level: Not on file  ?Occupational History  ? Not on file  ?Tobacco Use  ? Smoking status: Not on file  ? Smokeless tobacco: Not on file  ?Substance and Sexual Activity  ? Alcohol use: Not on file  ? Drug use: Not on file  ? Sexual activity: Not on file  ?Other Topics Concern  ? Not on file  ?Social History Narrative  ? Not on file  ? ?Social Determinants of Health  ? ?Financial Resource Strain: Not on file  ?Food Insecurity: Not on  file  ?Transportation Needs: Not on file  ?Physical Activity: Not on file  ?Stress: Not on file  ?Social Connections: Not on file  ? ? ? ?Review of Systems: A 12 point ROS discussed and pertinent positives are indicated in the HPI above.  All other systems are negative. ? ?Review of Systems  ?Constitutional:  Positive for fatigue. Negative for fever.  ?Respiratory:  Positive for cough and shortness of breath.   ?Cardiovascular:  Negative for chest pain.  ?Gastrointestinal:  Negative for abdominal pain.  ?Psychiatric/Behavioral:  Negative for behavioral problems and confusion.   ? ?Vital Signs: ?BP (!) 137/97   Pulse (!) 111   Temp 98 ?F (36.7 ?C) (Oral)   Resp (!) 23   Wt 180 lb 1.9 oz (81.7 kg)   SpO2 98%  ? ?Physical Exam ?Vitals and nursing note reviewed.  ?Constitutional:   ?   General: He is not in acute distress. ?   Appearance: Normal appearance. He is not ill-appearing.  ?HENT:  ?   Mouth/Throat:  ?   Mouth: Mucous membranes are moist.  ?Cardiovascular:  ?   Rate and Rhythm: Regular rhythm. Tachycardia present.  ?Pulmonary:  ?   Effort: Pulmonary effort is normal. No respiratory distress.  ?   Breath sounds: Normal breath sounds.  ?Musculoskeletal:  ?   Cervical back: Normal range of motion.  ?Skin: ?   General: Skin is warm.  ?Neurological:  ?   General: No  focal deficit present.  ?   Mental Status: He is alert and oriented to person, place, and time. Mental status is at baseline.  ?Psychiatric:     ?   Mood and Affect: Mood normal.     ?   Behavior: Behavior normal.     ?   Thought Content: Thought content normal.     ?   Judgment: Judgment normal.  ? ? ? ?MD Evaluation ?Airway: WNL ?Heart: WNL ?Abdomen: WNL ?Chest/ Lungs: WNL ?ASA  Classification: 3 ?Mallampati/Airway Score: Two ? ? ?Imaging: ?VAS [place] LOWER EXTREMITY ARTERIAL DUPLEX ? ?Result Date: 05/19/2021 ?LOWER EXTREMITY ARTERIAL DUPLEX STUDY Patient Name:  Kenneth Cross  Date of Exam:   05/19/2021 Medical Rec #: [id number]             Accession #:    [id number] Date of Birth: [birth year]            Patient Gender: M Patient Age:   74 years Exam Location:  [facility] Procedure:      VAS [place] LOWER EXTREMITY ARTERIAL DUPLEX Referring Phys: [facility] ALVA --------------------------------------------------------------------------------  Indications: Incidental popliteal artery occlusion on lower extremity venous              exam. Patient endorses rest pain and paresthesia. Other Factors: No risk factors identified.  Current ABI: Unable to obtain secondary to DVT bilaterally. Comparison Study: No prior studies. Performing Technologist: Jean Rosenthal RDMS, RVT  Examination Guidelines: A complete evaluation includes B-mode imaging, spectral Doppler, color Doppler, and power Doppler as needed of all accessible portions of each vessel. Bilateral testing is considered an integral part of a complete examination. Limited examinations for reoccurring indications may be performed as noted.  +-----------+--------+-----+--------+----------+----------+ LEFT       PSV cm/sRatioStenosisWaveform  Comments   +-----------+--------+-----+--------+----------+----------+ CFA Prox   101                  triphasic            +-----------+--------+-----+--------+----------+----------+ CFA Mid    75                   triphasic            +-----------+--------+-----+--------+----------+----------+ CFA Distal 48                   triphasic            +-----------+--------+-----+--------+----------+----------+ DFA        35                   triphasic            +-----------+--------+-----+--------+----------+----------+ SFA Prox   49                   triphasic            +-----------+--------+-----+--------+----------+----------+ SFA Mid    65                   triphasic            +-----------+--------+-----+--------+----------+----------+ SFA Distal 30                   triphasic             +-----------+--------+-----+--------+----------+----------+ POP Prox   14                   monophasicCollateral +-----------+--------+-----+--------+----------+----------+ POP Mid  occluded          Thrombus   +-----------+--------+-----+--------+----------+----------+ POP Distal              occluded          Thrombus   +-----------+--------+-----+--------+----------+----------+ ATA Prox   32                   monophasic           +-----------+--------+-----+--------+----------+----------+ ATA Mid    51                   monophasic           +-----------+--------+-----+--------+----------+----------+ ATA Distal 52                   monophasic           +-----------+--------+-----+--------+----------+----------+ PTA Prox                occluded                     +-----------+--------+-----+--------+----------+----------+ PTA Mid    22                   monophasic           +-----------+--------+-----+--------+----------+----------+ PTA Distal 17                   monophasic           +-----------+--------+-----+--------+----------+----------+ PERO Mid                occluded                     +-----------+--------+-----+--------+----------+----------+ PERO Distal             occluded                     +-----------+--------+-----+--------+----------+----------+ DP         25                   monophasic           +-----------+--------+-----+--------+----------+----------+  Acute-appearing thrombus noted in the mid to distal popliteal artery with collateralized flow to the posterior tibial and anterior tibial arteries.  Summary: Left: Total occlusion noted in the popliteal artery, TP trunk, and peroneal artery. Acute-appearing thrombus noted in the mid to distal popliteal artery with collateralized, monophasic flow to the posterior tibial and anterior tibial arteries.   See table(s) above for measurements and  observations.    Preliminary   ? ?VAS [place] LOWER EXTREMITY VENOUS (DVT) ? ?Result Date: 05/19/2021 ? Lower Venous DVT Study Patient Name:  Kenneth Cross  Date of Exam:   05/19/2021 Medical Rec #: [id number]            Accession #:    [id number] Date of Birth: January 26, 1976            Patient Gender: M Patient Age:   20 years Exam Location:  [facility] Procedure:      VAS [place] LOW

## 2021-05-19 NOTE — Progress Notes (Signed)
ANTICOAGULATION CONSULT NOTE - Follow-up Consult ? ?Pharmacy Consult for Heparin  ?Indication: pulmonary embolus ? ?Allergies  ?Allergen Reactions  ? Whole Blood   ?  Jehovah's - declines blood or blood products, will accept fractions such as albumin ,hemoglobin  ? ? ? ?Vital Signs: ?Temp: 98.6 ?F (37 ?C) (03/24 1916) ?Temp Source: Oral (03/24 1916) ?BP: 154/94 (03/24 1700) ?Pulse Rate: 103 (03/24 1700) ? ?Labs: ?Recent Labs  ?  05/19/21 ?0124 05/19/21 ?0309 05/19/21 ?0451 05/19/21 ?1046 05/19/21 ?1637 05/19/21 ?2129  ?HGB 7.1*  --  6.5*  --  6.2*  --   ?HCT 24.0*  --  22.5*  --  22.3*  --   ?PLT 173  --  147*  --   --   --   ?APTT 78*  --   --   --   --   --   ?LABPROT 14.4  --   --   --   --   --   ?INR 1.1  --   --   --   --   --   ?HEPARINUNFRC 0.41  --   --  0.62  --  0.42  ?CREATININE 1.16 1.07 1.02  --   --   --   ?TROPONINIHS 109* 114*  --   --   --   --   ? ? ? ?CrCl cannot be calculated (Unknown ideal weight.). ? ? ?Medical History: ?History reviewed. No pertinent past medical history. ? ?Assessment: ?46 y/o M transfer from outside facility with new onset pulmonary embolus and DVT as well as aterial   ? ?Noted low Hgb of 6.5 with melena and refuses blood products. Ok for iron and Aranesp. Discussed continuation with Dr. Elsworth Soho, [place] - benefit outweighs risk at present and continuing therapy to target low goal.  ? ?Heparin level 0.62 on current rate of 1500 units/hr -- targeting low goal of 0.3 to 0.5 so will reduce. Discussed bleeding with Dr. Elsworth Soho and due to extent of clotting, continuing Heparin for now.  ? ?Heparin came back therapeutic tonight. We will continue with the same rate.  ? ?Goal of Therapy:  ?Heparin level 0.3 to 0.5 units/ml ?Monitor platelets by anticoagulation protocol: Yes ?  ?Plan:  ?Cont heparin infusion 1350 units/hr ?Monitor CBC, clinical status, and degree of bleeding closely ? ?Onnie Boer, PharmD, BCIDP, AAHIVP, CPP ?Infectious Disease Pharmacist ?05/19/2021 10:04 PM ? ? ? ? ? ?

## 2021-05-19 NOTE — Progress Notes (Signed)
eLink Physician-Brief Progress Note ?Patient Name: Kenneth Cross ?DOB: 1975/11/15 ?MRN: [id number] ? ? ?Date of Service ? 05/19/2021  ?HPI/Events of Note ? Notified of hemoglobin 6.5  ?eICU Interventions ? Offered pRBC transfuseion, but he is refusing at this time.   ? ? ? ?  ? ?Thurnell Garbe CRUZ ?05/19/2021, 6:31 AM ?

## 2021-05-19 NOTE — Progress Notes (Signed)
? ?NAME:  Kenneth Cross, MRN:  [id number], DOB:  Aug 10, 1975, LOS: 0 ?ADMISSION DATE:  05/19/2021, CONSULTATION DATE: 3/24 ?REFERRING MD: Dr. Sharyne Peach EDP [place], CHIEF COMPLAINT: Pulm embolism ? ?History of Present Illness:  ?46 year old male with past medical history as below, which is significant for cerebellar degeneration and hypertension.  Due to his cerebellar degeneration he has issues with balance and is essentially homebound.  He spends a significant amount of time in a seated position.  On 3/19 he began to experience left lower extremity cramping followed shortly after by dyspnea on exertion.  He went to physical therapy on 3/23 and upon leaving he was severely short of breath and developed chest pain causing him to present to the emergency department in [place],  [place].  Also complained of left arm pain and stiffness.  Upon arrival to the emergency department patient was tachycardic to 115 and requiring 4 L of oxygen to maintain adequate O2 sats.  Due to the left arm symptoms her CT head was done to rule out stroke, which was nonacute.  Based on his respiratory complaints CT angiogram was done and demonstrated bilateral pulmonary emboli involving the distal main pulmonary arteries and all lobar branches.  There was CT evidence of right heart strain.  He was initiated on heparin infusion.  He was transferred to [facility] for ICU admission and consideration of endovascular intervention. ? ?Pertinent  Medical History  ?Cerebellar degeneration, hypertension, seizure, alcohol ? ?Significant Hospital Events: ?Including procedures, antibiotic start and stop dates in addition to other pertinent events   ? ? ?Interim History / Subjective:  ?Patient appears in no acute distress.  Endorses cramping and its bilateral upper extremities.  This has been a chronic issue but have worsened recently. ? ?Reports hematochezia that prompted to get a colonoscopy.  He was not aware of his  colonoscopy result.  He does not know his baseline hemoglobin. ? ?Reports intermittent alcohol use. ? ?Objective   ?Blood pressure (!) 128/91, pulse (!) 108, temperature 98.2 ?F (36.8 ?C), temperature source Oral, resp. rate (!) 23, weight 81.7 kg, SpO2 98 %. ?   ?   ? ?Intake/Output Summary (Last 24 hours) at 05/19/2021 0735 ?Last data filed at 05/19/2021 0600 ?Gross per 24 hour  ?Intake 177.76 ml  ?Output 175 ml  ?Net 2.76 ml  ? ?Filed Weights  ? 05/19/21 0340  ?Weight: 81.7 kg  ? ? ?Examination: ?General: Middle-aged male, not in acute distress ?HENT: Normocephalic, atraumatic, sclera anicteric ?Lungs: Clear bilateral breath sounds ?Cardiovascular: Tachycardic, regular, no MRG.  ?Abdomen: Soft, nontender, nondistended ?Extremities: No acute deformity.  Trace edema bilateral lower extremity.  Nonerythematous. ?Neuro: Alert, oriented, nonfocal ? ?Resolved Hospital Problem list   ?Shock ? ?Assessment & Plan:  ?Pulmonary emboli - Provoked in the setting immobilization 2/2 cerebellar dysfunction. ?PESI of 2 ?Bilateral clot burden in the distal main pulmonary arteries and all lobar branches.  Evidence of right heart strain on CT angiogram at outside hospital.  Troponin elevated.  Lactic normal. ?He is a difficult position regarding continue heparin gtt and acute blood loss anemia, likely from lower GI bleed. Patient is Jehovah's witness and will not accept any blood.  ?Currently hemodynamically stable ?-Continue heparin infusion ?-Check iron level before starting Epo ?-Pending Echo and DVT doppler.  ?-IR has been consult and may place IVD filter if positive for DVT. NPO for now.  ?-If his mobility does not improve he will likely need lifelong anticoagulation ? ?Acute blood loss anemia  ?  Probable lower GI bleed ?Reported persistent hematochezia. Had a colonoscopy last week that was unremarkable except for diverticulosis and polyps. His stool was maroon color this AM. Questionable diverticular bleed. ?-Check iron lv before  given Epo. Also check B12 and folate ?-H&H BID. Use pediatric tube ?-PPI IV BID ?-Will consult GI  ? ?Hypertension ?Currently off of pressors.  ?- Holding home lisinopril, propranolol ? ?Cerebellar degeneration ?Seizure ?- continue home oxcarbazepine, mirtazapine  ?- Currently NPO ? ?HLD ?- statin ? ?Gout ?- allopurinol ? ?Best Practice (right click and "Reselect all SmartList Selections" daily)  ? ?Diet/type: NPO ?DVT prophylaxis: systemic heparin ?GI prophylaxis: PPI ?Lines: N/A ?Foley:  N/A ?Code Status:  full code ?Last date of multidisciplinary goals of care discussion [ update mother ] ? ?Labs   ?CBC: ?Recent Labs  ?Lab 05/19/21 ?0124 05/19/21 ?0451  ?WBC 10.1 8.6  ?NEUTROABS 6.0  --   ?HGB 7.1* 6.5*  ?HCT 24.0* 22.5*  ?MCV 72.3* 72.6*  ?PLT 173 147*  ? ? ?Basic Metabolic Panel: ?Recent Labs  ?Lab 05/19/21 ?0124 05/19/21 ?0309 05/19/21 ?0451  ?NA 140 138 139  ?K 3.1* 3.8 3.5  ?CL 108 107 109  ?CO2 22 22 24   ?GLUCOSE 143* 112* 104*  ?BUN 24* 22* 22*  ?CREATININE 1.16 1.07 1.02  ?CALCIUM 8.6* 8.5* 8.4*  ?MG 1.6* 1.8  --   ?PHOS 2.2* 2.8  --   ? ?GFR: ?CrCl cannot be calculated (Unknown ideal weight.). ?Recent Labs  ?Lab 05/19/21 ?0109 05/19/21 ?0124 05/19/21 ?0429 05/19/21 ?0451  ?WBC  --  10.1  --  8.6  ?LATICACIDVEN 1.3  --  1.1  --   ? ? ?Liver Function Tests: ?Recent Labs  ?Lab 05/19/21 ?0124  ?AST 27  ?ALT 15  ?ALKPHOS 64  ?BILITOT 0.6  ?PROT 6.2*  ?ALBUMIN 3.0*  ? ?No results for input(s): LIPASE, AMYLASE in the last 168 hours. ?No results for input(s): AMMONIA in the last 168 hours. ? ?ABG ?No results found for: PHART, PCO2ART, PO2ART, HCO3, TCO2, ACIDBASEDEF, O2SAT  ? ?Coagulation Profile: ?Recent Labs  ?Lab 05/19/21 ?0124  ?INR 1.1  ? ? ?Cardiac Enzymes: ?No results for input(s): CKTOTAL, CKMB, CKMBINDEX, TROPONINI in the last 168 hours. ? ?HbA1C: ?No results found for: HGBA1C ? ?CBG: ?Recent Labs  ?Lab 05/19/21 ?[id number] 05/19/21 ?[id number] 05/19/21 ?0730  ?GLUCAP 109* 111* 110*  ? ? ? ?Review of Systems:    ?Bolds are positive  ?Constitutional: weight loss, gain, night sweats, Fevers, chills, fatigue .  ?HEENT: headaches, Sore throat, sneezing, nasal congestion, post nasal drip, Difficulty swallowing, Tooth/dental problems, visual complaints visual changes, ear ache ?CV:  chest pain, radiates:,Orthopnea, PND, swelling in lower extremities**, dizziness, palpitations, syncope.  ?GI  heartburn, indigestion, abdominal pain, nausea, vomiting, diarrhea, change in bowel habits, loss of appetite, bloody stools.  ?Resp: cough, productive: , hemoptysis, dyspnea, chest pain, pleuritic.  ?Skin: rash or itching or icterus ?GU: dysuria, change in color of urine, urgency or frequency. flank pain, hematuria  ?MS: joint pain or swelling. decreased range of motion  ?Psych: change in mood or affect. depression or anxiety.  ?Neuro: difficulty with speech, weakness, numbness, ataxia  ? ? ?Past Medical History:  ?He,  has no past medical history on file.  ? ?Surgical History:  ? ? ?Social History:  ?   ? ?Family History:  ?His family history is not on file.  ? ?Allergies ?Not on File  ? ?Home Medications  ?Prior to Admission medications   ?Not on File  ?  ? ?  Critical care time: 48 minutes necessary due to submassive PE requiring ICU admission, vasopressors, and IR evaluation.  ?  ? ? ?Gaylan Gerold, DO ?[facility] ? ?See Amion for personal pager ?PCCM on call pager [phone] until 7pm. ?Please call Elink 7p-7a. [phone] ? ?05/19/2021 7:35 AM ? ? ? ? ? ? ? ?

## 2021-05-19 NOTE — H&P (Addendum)
? ?NAME:  Kenneth Cross, MRN:  [id number], DOB:  10/08/1975, LOS: 0 ?ADMISSION DATE:  05/19/2021, CONSULTATION DATE: 3/24 ?REFERRING MD: Dr. Jackie Plum EDP [place], CHIEF COMPLAINT: Pulm embolism ? ?History of Present Illness:  ?46 year old male with past medical history as below, which is significant for cerebellar degeneration and hypertension.  Due to his cerebellar degeneration he has issues with balance and is essentially homebound.  He spends a significant amount of time in a seated position.  On 3/19 he began to experience left lower extremity cramping followed shortly after by dyspnea on exertion.  He went to physical therapy on 3/23 and upon leaving he was severely short of breath and developed chest pain causing him to present to the emergency department in [place],  [place].  Also complained of left arm pain and stiffness.  Upon arrival to the emergency department patient was tachycardic to 115 and requiring 4 L of oxygen to maintain adequate O2 sats.  Due to the left arm symptoms her CT head was done to rule out stroke, which was nonacute.  Based on his respiratory complaints CT angiogram was done and demonstrated bilateral pulmonary emboli involving the distal main pulmonary arteries and all lobar branches.  There was CT evidence of right heart strain.  He was initiated on heparin infusion.  He was transferred to [facility] for ICU admission and consideration of endovascular intervention. ? ?Pertinent  Medical History  ?Cerebellar degeneration, hypertension, seizure, alcohol ? ?Significant Hospital Events: ?Including procedures, antibiotic start and stop dates in addition to other pertinent events   ? ? ?Interim History / Subjective:  ? ? ?Objective   ?Blood pressure (!) 129/93, pulse (!) 111, resp. rate (!) 23, SpO2 95 %. ?   ?   ?No intake or output data in the 24 hours ending 05/19/21 0115 ?There were no vitals filed for this visit. ? ?Examination: ?General: Middle-aged  male with normal body habitus ?HENT: Normocephalic, atraumatic, PERRL, no JVD ?Lungs: Clear bilateral breath sounds ?Cardiovascular: Tachycardic, regular, no MRG.  ?Abdomen: Soft, nontender, nondistended ?Extremities: No acute deformity or range of motion limitation ?Neuro: Alert, oriented, nonfocal ? ?Resolved Hospital Problem list   ? ? ?Assessment & Plan:  ? ?Pulmonary emboli: Bilateral clot burden in the distal main pulmonary arteries and all lobar branches.  Evidence of right heart strain on CT angiogram at outside hospital.  This is likely a provoked incident as he underwent colonoscopy last week and is largely chair bound at baseline. ?sPESI 2 - high risk ?Labs from [place] BNP 1070, Trop HS 63 > 69 ?-Continue heparin infusion ?-IR has been involved and will formerly consult with tentative plans for endovascular intervention.  ?- NPO ?-Pharmacy has been consulted for titration of heparin infusion ?-Echocardiogram ?-Lower extremity Dopplers ?-If his mobility does not improve he will likely need lifelong anticoagulation ? ?Shock: presents to ICU on levophed and is hypertensive. Levo is quickly weaning and will likely be off entirely within the next several minutes.  ?- ICU hemodynamic monitoring ?- Holding home lisinopril, propranolol ? ?Cerebellar degeneration ?Seizure ?- continue home oxcarbazepine, mirtazapine ? ?HLD ?- statin ? ?Gout ?- allopurinol ? ? ?Best Practice (right click and "Reselect all SmartList Selections" daily)  ? ?Diet/type: NPO ?DVT prophylaxis: systemic heparin ?GI prophylaxis: PPI ?Lines: N/A ?Foley:  N/A ?Code Status:  full code ?Last date of multidisciplinary goals of care discussion [  ] ? ?Labs   ?CBC: ?No results for input(s): WBC, NEUTROABS, HGB, HCT, MCV, PLT in  the last 168 hours. ? ?Basic Metabolic Panel: ?No results for input(s): NA, K, CL, CO2, GLUCOSE, BUN, CREATININE, CALCIUM, MG, PHOS in the last 168 hours. ?GFR: ?CrCl cannot be calculated (No successful lab value  found.). ?No results for input(s): PROCALCITON, WBC, LATICACIDVEN in the last 168 hours. ? ?Liver Function Tests: ?No results for input(s): AST, ALT, ALKPHOS, BILITOT, PROT, ALBUMIN in the last 168 hours. ?No results for input(s): LIPASE, AMYLASE in the last 168 hours. ?No results for input(s): AMMONIA in the last 168 hours. ? ?ABG ?No results found for: PHART, PCO2ART, PO2ART, HCO3, TCO2, ACIDBASEDEF, O2SAT  ? ?Coagulation Profile: ?No results for input(s): INR, PROTIME in the last 168 hours. ? ?Cardiac Enzymes: ?No results for input(s): CKTOTAL, CKMB, CKMBINDEX, TROPONINI in the last 168 hours. ? ?HbA1C: ?No results found for: HGBA1C ? ?CBG: ?Recent Labs  ?Lab 05/19/21 ?8413  ?GLUCAP 109*  ? ? ?Review of Systems:   ?Bolds are positive  ?Constitutional: weight loss, gain, night sweats, Fevers, chills, fatigue .  ?HEENT: headaches, Sore throat, sneezing, nasal congestion, post nasal drip, Difficulty swallowing, Tooth/dental problems, visual complaints visual changes, ear ache ?CV:  chest pain, radiates:,Orthopnea, PND, swelling in lower extremities**, dizziness, palpitations, syncope.  ?GI  heartburn, indigestion, abdominal pain, nausea, vomiting, diarrhea, change in bowel habits, loss of appetite, bloody stools.  ?Resp: cough, productive: , hemoptysis, dyspnea, chest pain, pleuritic.  ?Skin: rash or itching or icterus ?GU: dysuria, change in color of urine, urgency or frequency. flank pain, hematuria  ?MS: joint pain or swelling. decreased range of motion  ?Psych: change in mood or affect. depression or anxiety.  ?Neuro: difficulty with speech, weakness, numbness, ataxia  ? ? ?Past Medical History:  ?He,  has no past medical history on file.  ? ?Surgical History:  ? ? ?Social History:  ?   ? ?Family History:  ?His family history is not on file.  ? ?Allergies ?Not on File  ? ?Home Medications  ?Prior to Admission medications   ?Not on File  ?  ? ?Critical care time: 48 minutes necessary due to submassive PE  requiring ICU admission, vasopressors, and IR evaluation.  ?  ? ? ?Joneen Roach, AGACNP-BC ?[facility] ? ?See Amion for personal pager ?PCCM on call pager [phone] until 7pm. ?Please call Elink 7p-7a. [phone] ? ?05/19/2021 1:44 AM ? ? ? ? ? ? ? ?

## 2021-05-19 NOTE — Progress Notes (Signed)
ANTICOAGULATION CONSULT NOTE - Initial Consult ? ?Pharmacy Consult for Heparin  ?Indication: pulmonary embolus ? ?Not on File ? ? ?Vital Signs: ?BP: 129/93 (03/24 0058) ?Pulse Rate: 111 (03/24 0058) ? ?Labs: ?Recent Labs  ?  05/19/21 ?0124  ?HGB 7.1*  ?HCT 24.0*  ?PLT 173  ?CREATININE 1.16  ?TROPONINIHS 109*  ? ? ?CrCl cannot be calculated (Unknown ideal weight.). ? ? ?Medical History: ?No past medical history on file. ? ?Assessment: ?46 y/o M transfer from outside facility with new onset pulmonary embolus.  ? ?Noted low Hgb of 7.1-watch closely  ? ?Heparin paused briefly upon transfer ? ?Goal of Therapy:  ?Heparin level 0.3-0.7 units/ml ?Monitor platelets by anticoagulation protocol: Yes ?  ?Plan:  ?Heparin infusion at 1500 units/hr ?1000 heparin level ?Trend Hgb  ? ?Narda Bonds, PharmD, BCPS ?Clinical Pharmacist ?Phone: [phone] ? ? ?

## 2021-05-19 NOTE — Progress Notes (Signed)
K+ 3.1, Phos 2.2, Mg 1.6 ?Replaced per protocol  ?

## 2021-05-20 DIAGNOSIS — K922 Gastrointestinal hemorrhage, unspecified: Secondary | ICD-10-CM

## 2021-05-20 LAB — BASIC METABOLIC PANEL
Anion gap: 9 (ref 5–15)
BUN: 13 mg/dL (ref 6–20)
CO2: 21 mmol/L — ABNORMAL LOW (ref 22–32)
Calcium: 8.1 mg/dL — ABNORMAL LOW (ref 8.9–10.3)
Chloride: 108 mmol/L (ref 98–111)
Creatinine, Ser: 1.12 mg/dL (ref 0.61–1.24)
GFR, Estimated: >60 mL/min (ref >60)
Glucose, Bld: 88 mg/dL (ref 70–99)
Potassium: 3.5 mmol/L (ref 3.5–5.1)
Sodium: 138 mmol/L (ref 135–145)

## 2021-05-20 LAB — GLUCOSE, CAPILLARY
Glucose-Capillary: 98 mg/dL (ref 70–99)
Glucose-Capillary: 89 mg/dL (ref 70–99)

## 2021-05-20 LAB — CBC
HCT: 22.7 % — ABNORMAL LOW (ref 39.0–52.0)
Hemoglobin: 6.4 g/dL — CL (ref 13.0–17.0)
MCH: 20.8 pg — ABNORMAL LOW (ref 26.0–34.0)
MCHC: 28.2 g/dL — ABNORMAL LOW (ref 30.0–36.0)
MCV: 73.9 fL — ABNORMAL LOW (ref 80.0–100.0)
Platelets: 145 10*3/uL — ABNORMAL LOW (ref 150–400)
RBC: 3.07 MIL/uL — ABNORMAL LOW (ref 4.22–5.81)
RDW: 16.3 % — ABNORMAL HIGH (ref 11.5–15.5)
WBC: 8.3 10*3/uL (ref 4.0–10.5)
nRBC: 0.2 % (ref 0.0–0.2)

## 2021-05-20 LAB — HEPARIN LEVEL (UNFRACTIONATED)
Heparin Unfractionated: 0.6 IU/mL (ref 0.30–0.70)
Heparin Unfractionated: 0.48 IU/mL (ref 0.30–0.70)

## 2021-05-20 MED ORDER — PANTOPRAZOLE SODIUM 40 MG PO TBEC
40.0000 mg | DELAYED_RELEASE_TABLET | Freq: Every day | ORAL | Status: DC
  Administered 2021-05-21 – 2021-05-26 (×6): 40 mg via ORAL
  Filled 2021-05-20 (×6): qty 1

## 2021-05-20 MED ORDER — MIRTAZAPINE 15 MG PO TABS
15.0000 mg | ORAL_TABLET | Freq: Every evening | ORAL | Status: DC | PRN
  Administered 2021-05-22: 15 mg via ORAL
  Filled 2021-05-20 (×2): qty 1

## 2021-05-20 MED ORDER — FERROUS SULFATE 325 (65 FE) MG PO TABS
325.0000 mg | ORAL_TABLET | Freq: Two times a day (BID) | ORAL | Status: DC
  Administered 2021-05-20 – 2021-05-26 (×12): 325 mg via ORAL
  Filled 2021-05-20 (×12): qty 1

## 2021-05-20 MED ORDER — POTASSIUM CHLORIDE CRYS ER 20 MEQ PO TBCR
40.0000 meq | EXTENDED_RELEASE_TABLET | Freq: Once | ORAL | Status: AC
Start: 2021-05-20 — End: 2021-05-20
  Administered 2021-05-20: 40 meq via ORAL
  Filled 2021-05-20: qty 2

## 2021-05-20 MED ORDER — AMLODIPINE BESYLATE 5 MG PO TABS
2.5000 mg | ORAL_TABLET | Freq: Once | ORAL | Status: AC
  Administered 2021-05-20: 2.5 mg via ORAL
  Filled 2021-05-20: qty 1

## 2021-05-20 MED ORDER — OXCARBAZEPINE 300 MG PO TABS
600.0000 mg | ORAL_TABLET | Freq: Two times a day (BID) | ORAL | Status: DC
  Administered 2021-05-20 – 2021-05-26 (×12): 600 mg via ORAL
  Filled 2021-05-20 (×16): qty 2

## 2021-05-20 MED ORDER — LEVOTHYROXINE SODIUM 25 MCG PO TABS
25.0000 ug | ORAL_TABLET | Freq: Every day | ORAL | Status: DC
  Administered 2021-05-20 – 2021-05-26 (×7): 25 ug via ORAL
  Filled 2021-05-20 (×7): qty 1

## 2021-05-20 MED ORDER — ALLOPURINOL 300 MG PO TABS
300.0000 mg | ORAL_TABLET | Freq: Every day | ORAL | Status: DC
  Administered 2021-05-21 – 2021-05-26 (×6): 300 mg via ORAL
  Filled 2021-05-20 (×6): qty 1

## 2021-05-20 NOTE — Progress Notes (Addendum)
? ? ? ?[facility] Progress Note ? ?CC:  Hematochezia and anemia ? ?Subjective:  Feeling better.  Having some small amounts of bleeding.  Eating lunch during our visit.   ? ?Objective:  ?Vital signs in last 24 hours: ?Temp:  [98.1 ?F (36.7 ?C)-99.3 ?F (37.4 ?C)] 98.4 ?F (36.9 ?C) (03/25 7408) ?Pulse Rate:  [83-111] 99 (03/25 0600) ?Resp:  [12-25] 25 (03/25 0600) ?BP: (112-165)/(82-120) 142/98 (03/25 0600) ?SpO2:  [90 %-100 %] 91 % (03/25 0600) ?Last BM Date : 05/19/21 ?General:  Alert, well-developed, in NAD; pale ?Heart:  Regular rate and rhythm; no murmurs ?Pulm:  CTAB.  No W/R/R. ?Abdomen:  Soft, non-distended.  BS present.  Non-tender. ?Extremities:  Without edema. ?Neurologic:  Alert and oriented x 4;  grossly normal neurologically. ?Psych:  Alert and cooperative. Normal mood and affect. ? ?Intake/Output from previous day: ?03/24 0701 - 03/25 0700 ?In: 1072.2 [P.O.:280; I.V.:427.5; IV Piggyback:364.6] ?Out: 875 [Urine:875] ?Intake/Output this shift: ?Total I/O ?In: -  ?Out: 150 [Urine:150] ? ?Lab Results: ?Recent Labs  ?  05/19/21 ?0124 05/19/21 ?0451 05/19/21 ?1637 05/20/21 ?0418  ?WBC 10.1 8.6  --  8.3  ?HGB 7.1* 6.5* 6.2* 6.4*  ?HCT 24.0* 22.5* 22.3* 22.7*  ?PLT 173 147*  --  145*  ? ?BMET ?Recent Labs  ?  05/19/21 ?0309 05/19/21 ?0451 05/20/21 ?0418  ?NA 138 139 138  ?K 3.8 3.5 3.5  ?CL 107 109 108  ?CO2 22 24 21*  ?GLUCOSE 112* 104* 88  ?BUN 22* 22* 13  ?CREATININE 1.07 1.02 1.12  ?CALCIUM 8.5* 8.4* 8.1*  ? ?LFT ?Recent Labs  ?  05/19/21 ?0124  ?PROT 6.2*  ?ALBUMIN 3.0*  ?AST 27  ?ALT 15  ?ALKPHOS 64  ?BILITOT 0.6  ? ?PT/INR ?Recent Labs  ?  05/19/21 ?0124  ?LABPROT 14.4  ?INR 1.1  ? ?IR IVC FILTER PLMT / S&I /IMG GUID/MOD SED ? ?Result Date: 05/19/2021 ?INDICATION: DVT and pulmonary embolism with contraindication to anticoagulation (lower GI bleeding). Given patient's multiple medical comorbidities the patient will NOT be actively followed by the interventional radiology service for  retrieval. EXAM: ULTRASOUND GUIDANCE FOR VASCULAR ACCESS IVC CATHETERIZATION AND VENOGRAM IVC FILTER INSERTION COMPARISON:  Chest CTA-earlier same day MEDICATIONS: None. ANESTHESIA/SEDATION: Moderate (conscious) sedation was employed during this procedure as administered by the Interventional Radiology RN. A total of Versed 1 mg and Fentanyl 25 mcg was administered intravenously. Moderate Sedation Time: 11 minutes. The patient's level of consciousness and vital signs were monitored continuously by radiology nursing throughout the procedure under my direct supervision. CONTRAST:  45 mL OMNIPAQUE IOHEXOL 300 MG/ML  SOLN FLUOROSCOPY TIME:  48 seconds (32 mGy) COMPLICATIONS: None immediate PROCEDURE: Informed consent was obtained from the patient following explanation of the procedure, risks, benefits and alternatives. The patient understands, agrees and consents for the procedure. All questions were addressed. A time out was performed prior to the initiation of the procedure. Maximal barrier sterile technique utilized including caps, mask, sterile gowns, sterile gloves, large sterile drape, hand hygiene, and Betadine prep. Under sterile condition and local anesthesia, right internal jugular venous access was performed with ultrasound. An ultrasound image was saved and sent to PACS. Over a guidewire, the IVC filter delivery sheath and inner dilator were advanced into the IVC just above the IVC bifurcation. Contrast injection was performed for an IVC venogram. Through the delivery sheath, a retrievable Denali IVC filter was deployed below the level of the renal veins and above the IVC bifurcation. Limited post deployment venacavagram  was performed. The delivery sheath was removed and hemostasis was obtained with manual compression. A dressing was placed. The patient tolerated the procedure well without immediate post procedural complication. FINDINGS: The IVC is patent. No evidence of thrombus, stenosis, or occlusion. No  variant venous anatomy. Successful placement of the IVC filter below the level of the renal veins. IMPRESSION: Successful ultrasound and fluoroscopically guided placement of an infrarenal retrievable IVC filter via right jugular approach. PLAN: Due to patient related comorbidities and/or clinical necessity, this IVC filter should be considered a permanent device. This patient will not be actively followed for future filter retrieval. Electronically Signed   By: Simonne Come M.D.   On: 05/19/2021 14:34  ? ?CT ANGIO AO+BIFEM W & OR WO CONTRAST ? ?Result Date: 05/19/2021 ?CLINICAL DATA:  Arterial embolism, lower extremity EXAM: CT ANGIOGRAPHY OF ABDOMINAL AORTA WITH ILIOFEMORAL RUNOFF TECHNIQUE: Multidetector CT imaging of the abdomen, pelvis and lower extremities was performed using the standard protocol during bolus administration of intravenous contrast. Multiplanar CT image reconstructions and MIPs were obtained to evaluate the vascular anatomy. RADIATION DOSE REDUCTION: This exam was performed according to the departmental dose-optimization program which includes automated exposure control, adjustment of the mA and/or kV according to patient size and/or use of iterative reconstruction technique. CONTRAST:  OMNIPAQUE IOHEXOL 350 MG/ML SOLN COMPARISON:  None. FINDINGS: VASCULAR Abdominal Aorta: The abdominal aorta is normal in caliber with scattered atherosclerotic disease. Celiac: Patent without significant stenosis. SMA: Patent without significant stenosis. IMA: Patent. Right Renal artery: Patent without significant stenosis. Left Renal artery: Patent without significant stenosis. Right lower extremity Common iliac artery: Patent without significant stenosis or aneurysm. Internal iliac artery: Patent. External iliac artery: Patent without significant stenosis. Common femoral artery: Patent without significant stenosis. Profunda femoral artery: There is occlusive thrombus involving a proximal branch of the  profunda artery. Superficial femoral artery: Patent without significant stenosis. Popliteal artery: Patent without significant stenosis or aneurysm. Tibioperoneal trunk: Mild atherosclerotic disease without significant stenosis. Runoff vessels: Patent three-vessel runoff to the ankle. Left lower extremity Common iliac artery: Patent without significant stenosis or aneurysm. Internal iliac artery: Patent. External iliac artery: Patent without significant stenosis. Common femoral artery: Patent without significant stenosis. Profunda femoral artery: There is occlusive thrombus involving a branch of the profunda artery. Superficial femoral artery: Patent without significant stenosis. Popliteal artery: There is occlusive thrombus of the popliteal artery spanning approximately 9 cm. Tibioperoneal trunk: Occlusive thrombus. Runoff vessels: Occluded proximally. There is reconstitution of a three-vessel runoff in the proximal calf that remains patent to the level of the ankle. Veins: Infrarenal IVC filter in place. Right femoral vein central venous catheter with tip terminating in the right iliac veins. NON-VASCULAR Inferior chest: Known bilateral pulmonary embolism. Hepatobiliary: The liver is normal in size without focal abnormality. No intrahepatic or extrahepatic biliary ductal dilation. The gallbladder appears normal. Spleen: Normal in size without focal abnormality. Pancreas: No pancreatic ductal dilatation or surrounding inflammatory changes. Adrenals/Urinary Tract: Adrenal glands are unremarkable. Kidneys are normal in size without hydronephrosis. Small right renal cyst. Foley catheter in place. Stomach/Bowel: The stomach, small bowel and large bowel are normal in caliber without abnormal wall thickening or surrounding inflammatory changes. Sigmoid diverticulosis without acute inflammatory changes. Reproductive: Prostate is unremarkable. Lymphatic: No enlarged lymph nodes in the abdomen or pelvis. Other: No  abdominopelvic ascites. Musculoskeletal: Healing left posterior tenth rib fracture. Left ankle internal fixation hardware. The soft tissues are unremarkable. IMPRESSION: VASCULAR 1. There is acute occlusive thromboembolis

## 2021-05-20 NOTE — Progress Notes (Signed)
ANTICOAGULATION CONSULT NOTE  ?Pharmacy Consult for Heparin  ?Indication: pulmonary embolus ? ?Allergies  ?Allergen Reactions  ? Whole Blood   ?  Jehovah's - declines blood or blood products, will accept fractions such as albumin ,hemoglobin  ? ?Vital Signs: ?Temp: 98.5 ?F (36.9 ?C) (03/25 1110) ?Temp Source: Oral (03/25 1110) ?BP: 130/96 (03/25 1300) ?Pulse Rate: 102 (03/25 1300) ? ?Labs: ?Recent Labs  ?  05/19/21 ?0124 05/19/21 ?0309 05/19/21 ?0451 05/19/21 ?1046 05/19/21 ?1637 05/19/21 ?2129 05/20/21 ?0418 05/20/21 ?1255  ?HGB 7.1*  --  6.5*  --  6.2*  --  6.4*  --   ?HCT 24.0*  --  22.5*  --  22.3*  --  22.7*  --   ?PLT 173  --  147*  --   --   --  145*  --   ?APTT 78*  --   --   --   --   --   --   --   ?LABPROT 14.4  --   --   --   --   --   --   --   ?INR 1.1  --   --   --   --   --   --   --   ?HEPARINUNFRC 0.41  --   --    < >  --  0.42 0.60 0.48  ?CREATININE 1.16 1.07 1.02  --   --   --  1.12  --   ?TROPONINIHS 109* 114*  --   --   --   --   --   --   ? < > = values in this interval not displayed.  ? ? ?CrCl cannot be calculated (Unknown ideal weight.). ? ?Assessment: ?46 y.o. male with PE and DVTs.  ? ?Heparin level 0.48 - in goal for low target but based on trend will decrease slightly by 50 units/hr to keep in tight goal.  ?Noted small amounts of bleeding - CCM aware and continuing therapy due to significant clotting ?-Hgb 6.4, Plts 145.  ?-Iron and Iron sats low 3/24 - IV iron given. Aranesp x1 dose given as well.  ? ?Goal of Therapy:  ?Heparin level 0.3 to 0.5 units/ml ?Monitor platelets by anticoagulation protocol: Yes ?  ?Plan:  ?Decrease Heparin 1150 units/hr ?Follow-up daily Heparin level (trying to decrease draws).  ?Monitor CBC and bleeding ? ?Sloan Leiter, PharmD, BCPS, BCCCP ?Clinical Pharmacist ?Please refer to [facility] for [place] numbers ?05/20/2021 1:28 PM ? ? ? ? ? ? ?

## 2021-05-20 NOTE — Plan of Care (Signed)
  Problem: Clinical Measurements: Goal: Respiratory complications will improve Outcome: Progressing   

## 2021-05-20 NOTE — Progress Notes (Signed)
ANTICOAGULATION CONSULT NOTE  ?Pharmacy Consult for Heparin  ?Indication: pulmonary embolus ?Brief A/P: Heparin level above goal--Decrease Heparin rate.  H/H stable ? ?Allergies  ?Allergen Reactions  ? Whole Blood   ?  Jehovah's - declines blood or blood products, will accept fractions such as albumin ,hemoglobin  ? ? ? ?Vital Signs: ?Temp: 99.3 ?F (37.4 ?C) (03/25 3570) ?Temp Source: Oral (03/25 0317) ?BP: 151/85 (03/25 0400) ?Pulse Rate: 83 (03/25 0400) ? ?Labs: ?Recent Labs  ?  05/19/21 ?0124 05/19/21 ?0309 05/19/21 ?0451 05/19/21 ?1046 05/19/21 ?1637 05/19/21 ?2129 05/20/21 ?0418  ?HGB 7.1*  --  6.5*  --  6.2*  --  6.4*  ?HCT 24.0*  --  22.5*  --  22.3*  --  22.7*  ?PLT 173  --  147*  --   --   --  145*  ?APTT 78*  --   --   --   --   --   --   ?LABPROT 14.4  --   --   --   --   --   --   ?INR 1.1  --   --   --   --   --   --   ?HEPARINUNFRC 0.41  --   --  0.62  --  0.42 0.60  ?CREATININE 1.16 1.07 1.02  --   --   --  1.12  ?TROPONINIHS 109* 114*  --   --   --   --   --   ? ? ? ?CrCl cannot be calculated (Unknown ideal weight.). ? ?Assessment: ?46 y.o. male with VTE for heparin  H/H remains low but stable.  RN reports hematuria this morning ? ?Goal of Therapy:  ?Heparin level 0.3 to 0.5 units/ml ?Monitor platelets by anticoagulation protocol: Yes ?  ?Plan:  ?Decrease Heparin 1200 units/hr ?Check heparin level in 8 hours. ? ?Geannie Risen, PharmD, BCPS ?05/20/2021 4:53 AM ? ? ? ? ? ? ?

## 2021-05-20 NOTE — Progress Notes (Addendum)
eLink Physician-Brief Progress Note ?Patient Name: Kenneth Cross ?DOB: 02-23-1976 ?MRN: [id number] ? ? ?Date of Service ? 05/20/2021  ?HPI/Events of Note ? BP 153/106, jehovas witness. PE/DVT. ?  ?eICU Interventions ? Takes lisinopril 20 at home. Cr normal. Sinus tach. On heparin gt. ?Discussed with RN. ? ?- amlodipine once oral for now.   ? ? ? ?Intervention Category ?Intermediate Interventions: Hypertension - evaluation and management ? ?Ranee Gosselin ?05/20/2021, 1:01 AM ? ?4;58 AM ?Hematuria : decreased heparin gtt to 12 U/hr.plt normal. ?Refusing PRBC due to [place] witness. Getting Iron. ?Hg stable > 6. ?- continue to watch. ? ?K 3.5.  ?Can take orally. Klor con 40 meq once ordered. ? ?

## 2021-05-20 NOTE — Plan of Care (Signed)
  Problem: Education: Goal: Knowledge of General Education information will improve Description: Including pain rating scale, medication(s)/side effects and non-pharmacologic comfort measures Outcome: Progressing   Problem: Safety: Goal: Ability to remain free from injury will improve Outcome: Progressing   Problem: Skin Integrity: Goal: Risk for impaired skin integrity will decrease Outcome: Progressing   

## 2021-05-20 NOTE — Progress Notes (Addendum)
? ?NAME:  Kenneth Cross, MRN:  [id number], DOB:  1975-03-16, LOS: 1 ?ADMISSION DATE:  05/19/2021, CONSULTATION DATE: 3/24 ?REFERRING MD: Dr. Jackie Plum EDP [place], CHIEF COMPLAINT: Pulm embolism ? ?History of Present Illness:  ?46 yo male developed leg cramping on 3/19.  Developed dyspnea and chest pain while at PT on 3/23.  He was sent to ER in [place], [place].  Found to have b/l PE with RV strain.  Transferred to [facility] for further therapy.  He is [employer]. ? ?Pertinent  Medical History  ?Cerebellar degeneration, hypertension, seizure, alcohol ? ?Significant Hospital Events: ?Including procedures, antibiotic start and stop dates in addition to other pertinent events   ?3/24 Transfer from [facility], IR/GI/Vascular surgery consulted, IVC filter placed ? ?Interim History / Subjective:  ?Denies chest pain, dyspnea, abdominal pain. ? ?Objective   ?Blood pressure (!) 142/98, pulse 99, temperature 98.4 ?F (36.9 ?C), temperature source Axillary, resp. rate (!) 25, weight 81.7 kg, SpO2 91 %. ?   ?   ? ?Intake/Output Summary (Last 24 hours) at 05/20/2021 1019 ?Last data filed at 05/20/2021 5462 ?Gross per 24 hour  ?Intake 1072.15 ml  ?Output 925 ml  ?Net 147.15 ml  ? ?Filed Weights  ? 05/19/21 0340  ?Weight: 81.7 kg  ? ? ?Examination: ? ?General - alert ?Eyes - pupils reactive ?ENT - no sinus tenderness, no stridor ?Cardiac - regular rate/rhythm, no murmur ?Chest - equal breath sounds b/l, no wheezing or rales ?Abdomen - soft, non tender, + bowel sounds ?Extremities - no cyanosis, clubbing, or edema ?Skin - no rashes ?Neuro - normal strength, moves extremities, follows commands ?Psych - normal mood and behavior ? ? ?Resolved Hospital Problem list   ?Distributive shock ? ?Assessment & Plan:  ? ?Acute pulmonary embolism with acute cor pulmonale. ?- s/p IVC filter by IR 3/24 after d/w IR ?- continue heparin gtt ?- if no significant GI bleeding, then likely can transition to oral anticoagulation later  this week ?- will likely need indefinite anticoagulation given limit mobility as risk factor for thromboembolic disease ? ?ABLA from hematochezia. ?- limit lab draws ?- seen by GI on 3/24 >> if bleeding recurs, then can consider bleeding scan to help localize source ?- iron replacement therapy ? ?Thrombus of Lt popliteal artery. ?- continue heparin gtt ?- vascular surgery consulted ? ?Jehovah's witness. ?- no blood products ? ?Hx of hypertension, hyperlipidemia. ?- hold outpt lisinopril and propranolol for now ? ?Hx of ETOH with cerebellar degeneration and seizure. ?- continue home oxcarbazepine, mirtazapine  ?- Currently NPO ? ?Hx of gout. ?- allopurinol ? ?Transfer to telemetry 3/25.  Will ask [facility] to assume care from 3/26 and PCCM off. ? ?Best Practice (right click and "Reselect all SmartList Selections" daily)  ? ?Diet/type: regular diet ?DVT prophylaxis: systemic heparin ?GI prophylaxis: protonix ?Code Status:  full code ?Last date of multidisciplinary goals of care discussion [ update mother ] ? ?Labs   ? ? ?  Latest Ref Rng & Units 05/20/2021  ?  4:18 AM 05/19/2021  ?  4:51 AM 05/19/2021  ?  3:09 AM  ?CMP  ?Glucose 70 - 99 mg/dL 88   703   500    ?BUN 6 - 20 mg/dL 13   22   22     ?Creatinine 0.61 - 1.24 mg/dL   9.38   1.82    ?Sodium 135 - 145 mmol/L 138   139   138    ?Potassium 3.5 - 5.1 mmol/L 3.5  3.5   3.8    ?Chloride 98 - 111 mmol/L 108   109   107    ?CO2 22 - 32 mmol/L 21   24   22     ?Calcium 8.9 - 10.3 mg/dL 8.1   8.4   8.5    ? ? ? ?  Latest Ref Rng & Units 05/20/2021  ?  4:18 AM 05/19/2021  ?  4:37 PM 05/19/2021  ?  4:51 AM  ?CBC  ?WBC 4.0 - 10.5 K/uL 8.3    8.6    ?Hemoglobin 13.0 - 17.0 g/dL 6.4   6.2   6.5    ?Hematocrit 39.0 - 52.0 % 22.7   22.3   22.5    ?Platelets 150 - 400 K/uL 145    147    ? ? ?CBG (last 3)  ?Recent Labs  ?  05/19/21 ?2331 05/20/21 ?05/22/21 05/20/21 ?0737  ?GLUCAP 89 98 89  ? ?Signature:  ?05/22/21, MD ?[facility] ?Pager - [phone] -  [phone] ?05/20/2021, 11:10 AM ? ? ?

## 2021-05-21 DIAGNOSIS — G119 Hereditary ataxia, unspecified: Secondary | ICD-10-CM

## 2021-05-21 DIAGNOSIS — E039 Hypothyroidism, unspecified: Secondary | ICD-10-CM

## 2021-05-21 DIAGNOSIS — D509 Iron deficiency anemia, unspecified: Secondary | ICD-10-CM

## 2021-05-21 DIAGNOSIS — E785 Hyperlipidemia, unspecified: Secondary | ICD-10-CM

## 2021-05-21 DIAGNOSIS — Z531 Procedure and treatment not carried out because of patient's decision for reasons of belief and group pressure: Secondary | ICD-10-CM

## 2021-05-21 DIAGNOSIS — I10 Essential (primary) hypertension: Secondary | ICD-10-CM

## 2021-05-21 LAB — HEPARIN LEVEL (UNFRACTIONATED): Heparin Unfractionated: 0.42 IU/mL (ref 0.30–0.70)

## 2021-05-21 NOTE — Progress Notes (Signed)
?PROGRESS NOTE ? ?Kenneth Cross  TKW:[id number] DOB: 02/09/1976 DOA: 05/19/2021 ?PCP: Catha Nottingham, FNP  ? ?Brief Narrative: ? ?Patient is a 46 year old male with history of cerebellar degeneration causing ataxia, hypothyroidism, hypertension, seizure disorder, alcohol use, Jehovah's witness who was transferred from [facility] emergency department for the management of bilateral PE with right ventricular strain.  He initially presented there with dyspnea, chest pain.  He was admitted under ICU service.  After admission, IR, vascular surgery consulted, he underwent IVC filter placement, I started on heparin drip.  Patient transferred to our service on 3/26 ? ?Assessment & Plan: ? ?Principal Problem: ?  Pulmonary embolism (HCC) ?Active Problems: ?  Acute deep vein thrombosis (DVT) of popliteal vein of both lower extremities (HCC) ?  LGI bleed ?  Cerebral ataxia (HCC) ?  Blood transfusion declined because patient is Jehovah's Witness ?  Microcytic anemia ?  Hypothyroidism ?  Hyperlipidemia ?  Essential hypertension ? ?Acute PE with right ventricular strain: Presented with dyspnea, chest pain.  Started on heparin drip on admission.  Echo done here showed EF of 65 to 70%, right ventricular pressure overload, moderately elevated pulmonary artery pressure. ?He underwent IVC filter placement by IR.  We are planning to start Eliquis, likely tomorrow.  Challenging situation to put him on Eliquis because of his chronic anemia and Jehovah's Witness status. ? ?Acute hypoxic respiratory failure: Secondary to PE.  Currently on 2 L of oxygen.  We will continue with the oxygen ? ?Microcytic anemia: Currently hemoglobin in the range of 6.  History of recent hematochezia.  Had a colonoscopy with local GI and was found to have diverticuli and polyp in the rectum.  Had a couple of episodes of rectal bleeding.  Currently does not have any active rectal bleeding.  Suspected to be from hemorrhoidal source versus  diverticular source.  No intervention planned by GI.  Continue iron supplementation. ? ?Bilateral popliteal vein DVT/left popliteal artery thrombus: IVC filter already placed.  Vascular surgery was following.  Vascular surgery recommending to continue anticoagulation. ? ?Jehovah's witness: Denies blood products.  Continue iron supplementation ? ?Hypertension: Currently blood pressure stable.  Takes lisinopril, propranolol at home, currently on hold ? ?Hyperlipidemia: Continue statin ? ?History of alcohol use with cerebral degeneration/seizure: On home oxcarbazepine, mirtazapine ? ?History of gout: On allopurinol ? ?History of hypothyroidism: Continue levothyroxine ? ?History of ataxia: Uses walker for ambulation.  PT/OT consulted ? ? ?  ?  ? ?DVT prophylaxis:  IV heparin ? ? ?  Code Status: Full Code ? ?Family Communication: None at the bedside ? ?Patient status: Inpatient ? ?Patient is from : Home ? ?Anticipated discharge to: Home ? ?Estimated DC date: In next 2 to 3 days ? ? ?Consultants: PCCM, vascular surgery, IR ? ?Procedures: IVC filter placement ? ?Antimicrobials:  ?Anti-infectives (From admission, onward)  ? ? None  ? ?  ? ? ?Subjective: ? ?Patient seen and examined at the bedside this morning.  Hemodynamically stable.   Denies any worsening shortness of breath.  On 2 L of oxygen per minute.  Comfortably lying in bed ? ?Objective: ?Vitals:  ? 05/20/21 2050 05/21/21 0500 05/21/21 0545 05/21/21 9242  ?BP: (!) 149/96  (!) 136/95 131/86  ?Pulse: 97  93 96  ?Resp:    16  ?Temp: 99.1 ?F (37.3 ?C)  98.7 ?F (37.1 ?C) 98.8 ?F (37.1 ?C)  ?TempSrc: Oral  Oral Oral  ?SpO2: 100%  98% 98%  ?Weight:  82.4 kg    ? ? ?  Intake/Output Summary (Last 24 hours) at 05/21/2021 1116 ?Last data filed at 05/21/2021 0900 ?Gross per 24 hour  ?Intake 567.97 ml  ?Output 625 ml  ?Net -57.03 ml  ? ?Filed Weights  ? 05/19/21 0340 05/21/21 0500  ?Weight: 81.7 kg 82.4 kg  ? ? ?Examination: ? ?General exam: Overall comfortable, not in  distress ?HEENT: PERRL ?Respiratory system:  no wheezes or crackles  ?Cardiovascular system: S1 & S2 heard, RRR.  ?Gastrointestinal system: Abdomen is nondistended, soft and nontender. ?Central nervous system: Alert and oriented ?Extremities: No edema, no clubbing ,no cyanosis ?Skin: No rashes, no ulcers,no icterus   ? ? ?Data Reviewed: I have personally reviewed following labs and imaging studies ? ?CBC: ?Recent Labs  ?Lab 05/19/21 ?0124 05/19/21 ?0451 05/19/21 ?1637 05/20/21 ?0418  ?WBC 10.1 8.6  --  8.3  ?NEUTROABS 6.0  --   --   --   ?HGB 7.1* 6.5* 6.2* 6.4*  ?HCT 24.0* 22.5* 22.3* 22.7*  ?MCV 72.3* 72.6*  --  73.9*  ?PLT 173 147*  --  145*  ? ?Basic Metabolic Panel: ?Recent Labs  ?Lab 05/19/21 ?0124 05/19/21 ?0309 05/19/21 ?0451 05/20/21 ?[id number]  ?NA 140 138 139 138  ?K 3.1* 3.8 3.5 3.5  ?CL 108 107 109 108  ?CO2 22 22 24  21*  ?GLUCOSE 143* 112* 104* 88  ?BUN 24* 22* 22* 13  ?CREATININE 1.16 1.07 1.02 1.12  ?CALCIUM 8.6* 8.5* 8.4* 8.1*  ?MG 1.6* 1.8  --   --   ?PHOS 2.2* 2.8  --   --   ? ? ? ?Recent Results (from the past 240 hour(s))  ?MRSA Next Gen by PCR, Nasal     Status: None  ? Collection Time: 05/19/21 12:44 AM  ? Specimen: Nasal Mucosa; Nasal Swab  ?Result Value Ref Range Status  ? MRSA by PCR Next Gen NOT DETECTED NOT DETECTED Final  ?  Comment: (NOTE) ?The GeneXpert MRSA Assay (FDA approved for NASAL specimens only), ?is one component of a comprehensive MRSA colonization surveillance ?program. It is not intended to diagnose MRSA infection nor to guide ?or monitor treatment for MRSA infections. ?Test performance is not FDA approved in patients less than 2 years ?old. ?Performed at [facility], [address] [place], [place] ?[id number] ?  ?  ? ?Radiology Studies: ?IR IVC FILTER PLMT / S&I /IMG GUID/MOD SED ? ?Result Date: 05/19/2021 ?INDICATION: DVT and pulmonary embolism with contraindication to anticoagulation (lower GI bleeding). Given patient's multiple medical comorbidities the patient will  NOT be actively followed by the interventional radiology service for retrieval. EXAM: ULTRASOUND GUIDANCE FOR VASCULAR ACCESS IVC CATHETERIZATION AND VENOGRAM IVC FILTER INSERTION COMPARISON:  Chest CTA-earlier same day MEDICATIONS: None. ANESTHESIA/SEDATION: Moderate (conscious) sedation was employed during this procedure as administered by the Interventional Radiology RN. A total of Versed 1 mg and Fentanyl 25 mcg was administered intravenously. Moderate Sedation Time: 11 minutes. The patient's level of consciousness and vital signs were monitored continuously by radiology nursing throughout the procedure under my direct supervision. CONTRAST:  45 mL OMNIPAQUE IOHEXOL 300 MG/ML  SOLN FLUOROSCOPY TIME:  48 seconds (32 mGy) COMPLICATIONS: None immediate PROCEDURE: Informed consent was obtained from the patient following explanation of the procedure, risks, benefits and alternatives. The patient understands, agrees and consents for the procedure. All questions were addressed. A time out was performed prior to the initiation of the procedure. Maximal barrier sterile technique utilized including caps, mask, sterile gowns, sterile gloves, large sterile drape, hand hygiene, and Betadine prep.  Under sterile condition and local anesthesia, right internal jugular venous access was performed with ultrasound. An ultrasound image was saved and sent to PACS. Over a guidewire, the IVC filter delivery sheath and inner dilator were advanced into the IVC just above the IVC bifurcation. Contrast injection was performed for an IVC venogram. Through the delivery sheath, a retrievable Denali IVC filter was deployed below the level of the renal veins and above the IVC bifurcation. Limited post deployment venacavagram was performed. The delivery sheath was removed and hemostasis was obtained with manual compression. A dressing was placed. The patient tolerated the procedure well without immediate post procedural complication. FINDINGS:  The IVC is patent. No evidence of thrombus, stenosis, or occlusion. No variant venous anatomy. Successful placement of the IVC filter below the level of the renal veins. IMPRESSION: Successful ultrasound and fluoroscopicall

## 2021-05-21 NOTE — Evaluation (Signed)
Physical Therapy Evaluation ?Patient Details ?Name: Fran Nolon BussingWayne Sarria ?MRN: [id number] ?DOB: 01/08/1976 ?Today's Date: 05/21/2021 ? ?History of Present Illness ? 46 yo male developed leg cramping on 3/19 (bil DVTs)  Developed dyspnea and chest pain while at PT on 3/23.  He was sent to ER in [place], [place]  Found to have b/l PE with RV strain.  [facility] for further therapy. IVC filter placed 3/24; Hematochezia (Hemorrhoidal versus diverticular) as well; Hgb 6.4 3/26 (declines blood products); with significant PMH of with cerebellar degeneration, diverticula, HTN, gait ataxia, and seizure disorder  ?Clinical Impression ?  ?Pt admitted with above diagnosis. Lives at home alone, in a single-level home (trailer) with a few steps to enter; Prior to admission, pt was able to ambulate with a RW, though reports frequent falls (was going to Outpt PT for gait and balance; Presents to PT with generalized weakness, dyscoordinated and ataxic movements, high fall risk, decr activity tolerance; Abel to get to EOB without phsyical assist; needs mod assist of 2 people to safely stand at bedside; noted significnat HR incr with activity;  Pt currently with functional limitations due to the deficits listed below (see PT Problem List). Pt will benefit from skilled PT to increase their independence and safety with mobility to allow discharge to the venue listed below.  ?   ? ?Recommendations for follow up therapy are one component of a multi-disciplinary discharge planning process, led by the attending physician.  Recommendations may be updated based on patient status, additional functional criteria and insurance authorization. ? ?Follow Up Recommendations Skilled nursing-short term rehab (<3 hours/day) ? ?  ?Assistance Recommended at Discharge Frequent or constant Supervision/Assistance  ?Patient can return home with the following ? A lot of help with walking and/or transfers;Assistance with cooking/housework;Help with stairs  or ramp for entrance ? ?  ?Equipment Recommendations Rolling walker (2 wheels);Wheelchair (measurements PT);Wheelchair cushion (measurements PT);BSC/3in1 (must consider -- DME may not fit in his home)  ?Recommendations for Other Services ?  (Will consider OT consult)  ?  ?Functional Status Assessment Patient has had a recent decline in their functional status and demonstrates the ability to make significant improvements in function in a reasonable and predictable amount of time.  ? ?  ?Precautions / Restrictions Precautions ?Precautions: Fall ?Precaution Comments: HR tachy; watch O2 sats  ? ?  ? ?Mobility ? Bed Mobility ?Overal bed mobility: Modified Independent ?  ?  ?  ?  ?  ?  ?General bed mobility comments: slwo moving, but no assist needed to move into sitting EOB ?  ? ?Transfers ?Overall transfer level: Needs assistance ?Equipment used: Rolling walker (2 wheels) ?Transfers: Sit to/from Stand ?Sit to Stand: Mod assist, +2 physical assistance ?  ?  ?  ?  ?  ?General transfer comment: 2 person mod assist to rise and steady; noted HR incr and O2 sat decr; Pt taxed, and requesting to sit back down to bed ?  ? ?Ambulation/Gait ?  ?  ?  ?  ?  ?  ?  ?  ? ?Stairs ?  ?  ?  ?  ?  ? ?Wheelchair Mobility ?  ? ?Modified Rankin (Stroke Patients Only) ?  ? ?  ? ?Balance Overall balance assessment: Needs assistance ?  ?Sitting balance-Leahy Scale: Good ?  ?  ?Standing balance support: Bilateral upper extremity supported ?Standing balance-Leahy Scale: Poor ?  ?  ?  ?  ?  ?  ?  ?  ?  ?  ?  ?  ?   ? ? ? ?  Pertinent Vitals/Pain Pain Assessment ?Pain Assessment: No/denies pain  ? ? ?Home Living Family/patient expects to be discharged to:: Private residence ?Living Arrangements: Alone ?Available Help at Discharge: Family;Available PRN/intermittently (Mother checks on pt, does IADLs) ?Type of Home: Mobile home ("trailer") ?Home Access: Stairs to enter ?  ?Entrance Stairs-Number of Steps: 4 ?  ?Home Layout: One level ?Home Equipment:  Agricultural consultant (2 wheels);Shower seat ?Additional Comments: does not sue supplemental O2 at baseline  ?  ?Prior Function Prior Level of Function : Needs assist ?  ?  ?  ?  ?  ?  ?Mobility Comments: uses RW for amb, though not a lot of room inside trailer, and tends to furniture walk; recently with falls and difficulty walking, and would sit down and scoot around his trailer approx 50% of the time per pt ?ADLs Comments: shower seat; tends to eat microwave meals ?  ? ? ?Hand Dominance  ?   ? ?  ?Extremity/Trunk Assessment  ? Upper Extremity Assessment ?Upper Extremity Assessment: Generalized weakness (noting dyscoordinated, asterixis-like movements of all extremities) ?  ? ?Lower Extremity Assessment ?Lower Extremity Assessment: Generalized weakness (noting dyscoordinated, asterixis-like movements of all extremities) ?  ? ?   ?Communication  ? Communication: No difficulties  ?Cognition Arousal/Alertness: Awake/alert ?Behavior During Therapy: [facility] for tasks assessed/performed, Flat affect ?Overall Cognitive Status: Within Functional Limits for tasks assessed ?  ?  ?  ?  ?  ?  ?  ?  ?  ?  ?  ?  ?  ?  ?  ?  ?  ?  ?  ? ?  ?General Comments General comments (skin integrity, edema, etc.): Session conducted on supplemental O2 and sats decr to 82%; HR incr to in teh 130as with sit<>stand ? ?  ?Exercises    ? ?Assessment/Plan  ?  ?PT Assessment Patient needs continued PT services  ?PT Problem List Decreased strength;Decreased activity tolerance;Decreased balance;Decreased mobility;Decreased coordination;Decreased knowledge of use of DME;Decreased safety awareness;Decreased knowledge of precautions;Cardiopulmonary status limiting activity ? ?   ?  ?PT Treatment Interventions DME instruction;Gait training;Stair training;Functional mobility training;Therapeutic activities;Therapeutic exercise;Balance training;Neuromuscular re-education;Patient/family education;Cognitive remediation   ? ?PT Goals (Current goals can be found in the  Care Plan section)  ?Acute Rehab PT Goals ?Patient Stated Goal: Did not state ?PT Goal Formulation: With patient ?Time For Goal Achievement: 06/04/21 ?Potential to Achieve Goals: Fair ? ?  ?Frequency Min 3X/week ?  ? ? ?Co-evaluation   ?  ?  ?  ?  ? ? ?  ?AM-PAC PT "6 Clicks" Mobility  ?Outcome Measure Help needed turning from your back to your side while in a flat bed without using bedrails?: None ?Help needed moving from lying on your back to sitting on the side of a flat bed without using bedrails?: None ?Help needed moving to and from a bed to a chair (including a wheelchair)?: A Lot ?Help needed standing up from a chair using your arms (e.g., wheelchair or bedside chair)?: A Lot ?Help needed to walk in hospital room?: Total ?Help needed climbing 3-5 steps with a railing? : Total ?6 Click Score: 14 ? ?  ?End of Session Equipment Utilized During Treatment: Gait belt;Oxygen ?Activity Tolerance: Patient limited by fatigue ?Patient left: in bed;with call bell/phone within reach;with bed alarm set ?  ?PT Visit Diagnosis: Unsteadiness on feet (R26.81);Muscle weakness (generalized) (M62.81);Repeated falls (R29.6) ?  ? ?Time: 1610-9604 ?PT Time Calculation (min) (ACUTE ONLY): 20 min ? ? ?Charges:   PT Evaluation ?$PT Eval  Moderate Complexity: 1 Mod ?  ?  ?   ? ? ?Van Clines, PT  ?Acute Rehabilitation Services ?Pager [phone] ?Office [phone] ? ? ?Levi Aland ?05/21/2021, 2:27 PM ? ?

## 2021-05-21 NOTE — Progress Notes (Signed)
ANTICOAGULATION CONSULT NOTE  ?Pharmacy Consult for Heparin  ?Indication: pulmonary embolus ? ?Allergies  ?Allergen Reactions  ? Whole Blood   ?  [facility] - declines blood or blood products, will accept fractions such as albumin ,hemoglobin  ? ?Vital Signs: ?Temp: 98.7 ?F (37.1 ?C) (03/26 0545) ?Temp Source: Oral (03/26 0545) ?BP: 136/95 (03/26 0545) ?Pulse Rate: 93 (03/26 0545) ? ?Labs: ?Recent Labs  ?  05/19/21 ?0124 05/19/21 ?0309 05/19/21 ?0451 05/19/21 ?1046 05/19/21 ?1637 05/19/21 ?2129 05/20/21 ?0418 05/20/21 ?1255 05/21/21 ?0501  ?HGB 7.1*  --  6.5*  --  6.2*  --  6.4*  --   --   ?HCT 24.0*  --  22.5*  --  22.3*  --  22.7*  --   --   ?PLT 173  --  147*  --   --   --  145*  --   --   ?APTT 78*  --   --   --   --   --   --   --   --   ?LABPROT 14.4  --   --   --   --   --   --   --   --   ?INR 1.1  --   --   --   --   --   --   --   --   ?HEPARINUNFRC 0.41  --   --    < >  --    < > 0.60 0.48 0.42  ?CREATININE 1.16 1.07 1.02  --   --   --  1.12  --   --   ?TROPONINIHS 109* 114*  --   --   --   --   --   --   --   ? < > = values in this interval not displayed.  ? ? ? ?CrCl cannot be calculated (Unknown ideal weight.). ? ?Assessment: ?46 y.o. male with PE and DVTs.  ? ?Heparin level 0.42, therapeutic ?No bleeding reported overnight by RN ?No CBC today (minimizing lab draws) ? ?Goal of Therapy:  ?Heparin level 0.3 to 0.5 units/ml ?Monitor platelets by anticoagulation protocol: Yes ?  ?Plan:  ?Continue Heparin 1150 units/hr ?Follow-up daily Heparin level (trying to decrease draws).  ?Monitor CBC and bleeding ? ?Wilburn Cornelia, PharmD, BCPS ?Clinical Pharmacist ? ?Please refer to [facility] for [facility] numbers ?05/21/2021 7:12 AM ? ? ? ? ? ? ?

## 2021-05-22 ENCOUNTER — Other Ambulatory Visit (HOSPITAL_COMMUNITY): Payer: Self-pay

## 2021-05-22 LAB — CBC
HCT: 23.1 % — ABNORMAL LOW (ref 39.0–52.0)
Hemoglobin: 6.3 g/dL — CL (ref 13.0–17.0)
MCH: 21.2 pg — ABNORMAL LOW (ref 26.0–34.0)
MCHC: 27.3 g/dL — ABNORMAL LOW (ref 30.0–36.0)
MCV: 77.8 fL — ABNORMAL LOW (ref 80.0–100.0)
Platelets: 195 10*3/uL (ref 150–400)
RBC: 2.97 MIL/uL — ABNORMAL LOW (ref 4.22–5.81)
RDW: 16.7 % — ABNORMAL HIGH (ref 11.5–15.5)
WBC: 9.6 10*3/uL (ref 4.0–10.5)
nRBC: 1 % — ABNORMAL HIGH (ref 0.0–0.2)

## 2021-05-22 LAB — HEPARIN LEVEL (UNFRACTIONATED)
Heparin Unfractionated: 0.26 IU/mL — ABNORMAL LOW (ref 0.30–0.70)
Heparin Unfractionated: 0.34 IU/mL (ref 0.30–0.70)

## 2021-05-22 MED ORDER — APIXABAN 5 MG PO TABS
10.0000 mg | ORAL_TABLET | Freq: Two times a day (BID) | ORAL | Status: DC
  Administered 2021-05-22 – 2021-05-26 (×9): 10 mg via ORAL
  Filled 2021-05-22 (×9): qty 2

## 2021-05-22 MED ORDER — APIXABAN 5 MG PO TABS: 5.0000 mg | ORAL_TABLET | Freq: Two times a day (BID) | ORAL | Status: DC

## 2021-05-22 NOTE — Discharge Instructions (Addendum)
Information on my medicine - ELIQUIS? (apixaban) ? ?This medication education was reviewed with me or my healthcare representative as part of my discharge preparation. ? ?Why was Eliquis? prescribed for you? ?Eliquis? was prescribed to treat blood clots that may have been found in the veins of your legs (deep vein thrombosis) or in your lungs (pulmonary embolism) and to reduce the risk of them occurring again. ? ?What do You need to know about Eliquis? ? ?The starting dose is 10 mg (two 5 mg tablets) taken TWICE daily for the FIRST SEVEN (7) DAYS, then on 05/29/21  the dose is reduced to ONE 5 mg tablet taken TWICE daily.  Eliquis? may be taken with or without food.  ? ?Try to take the dose about the same time in the morning and in the evening. If you have difficulty swallowing the tablet whole please discuss with your pharmacist how to take the medication safely. ? ?Take Eliquis? exactly as prescribed and DO NOT stop taking Eliquis? without talking to the doctor who prescribed the medication.  Stopping may increase your risk of developing a new blood clot.  Refill your prescription before you run out. ? ?After discharge, you should have regular check-up appointments with your healthcare provider that is prescribing your Eliquis?. ?   ?What do you do if you miss a dose? ?If a dose of ELIQUIS? is not taken at the scheduled time, take it as soon as possible on the same day and twice-daily administration should be resumed. The dose should not be doubled to make up for a missed dose. ? ?Important Safety Information ?A possible side effect of Eliquis? is bleeding. You should call your healthcare provider right away if you experience any of the following: ?Bleeding from an injury or your nose that does not stop. ?Unusual colored urine (red or dark brown) or unusual colored stools (red or black). ?Unusual bruising for unknown reasons. ?A serious fall or if you hit your head (even if there is no bleeding). ? ?Some medicines  may interact with Eliquis? and might increase your risk of bleeding or clotting while on Eliquis?. To help avoid this, consult your healthcare provider or pharmacist prior to using any new prescription or non-prescription medications, including herbals, vitamins, non-steroidal anti-inflammatory drugs (NSAIDs) and supplements. ? ?This website has more information on Eliquis? (apixaban): http://www.eliquis.com/eliquis/home  ? ?

## 2021-05-22 NOTE — Evaluation (Signed)
Occupational Therapy Evaluation ?Patient Details ?Name: Kenneth Cross ?MRN: [id number] ?DOB: 09/25/1975 ?Today's Date: 05/22/2021 ? ? ?History of Present Illness 46 yo male developed leg cramping on 3/19 (bil DVTs)  Developed dyspnea and chest pain while at PT on 3/23.  He was sent to ER in [place] at [place], [place].  Found to have b/l PE with RV strain.  Transferred to [facility] for further therapy. IVC filter placed 3/24; Hematochezia (Hemorrhoidal versus diverticular) as well; Hgb 6.4 3/26 (declines blood products); with significant PMH of with cerebellar degeneration, diverticula, HTN, gait ataxia, and seizure disorder  ? ?Clinical Impression ?  ?Pt reports living independently, parent checks on him and assists with ADLs. Pt independent with ADLs, uses RW and furniture walks for mobility. Pt currently min-max A +2 for ADLs, mod I for bed mobility, and min-mod A +2 for step pivot transfer to recliner. Pt tachycardic with HR up to 150's with transfers/short distance ambulation. Pt presenting with impairments listed below, will follow acutely.  Recommend SNF at d/c. ?   ? ?Recommendations for follow up therapy are one component of a multi-disciplinary discharge planning process, led by the attending physician.  Recommendations may be updated based on patient status, additional functional criteria and insurance authorization.  ? ?Follow Up Recommendations ? Skilled nursing-short term rehab (<3 hours/day)  ?  ?Assistance Recommended at Discharge Intermittent Supervision/Assistance  ?Patient can return home with the following A lot of help with bathing/dressing/bathroom;A lot of help with walking and/or transfers;Assistance with cooking/housework;Assist for transportation;Help with stairs or ramp for entrance ? ?  ?Functional Status Assessment ? Patient has had a recent decline in their functional status and demonstrates the ability to make significant improvements in function in a reasonable and predictable amount of time.   ?Equipment Recommendations ? BSC/3in1  ?  ?Recommendations for Other Services   ? ? ?  ?Precautions / Restrictions Precautions ?Precautions: Fall ?Precaution Comments: HR tachy; watch O2 sats ?Restrictions ?Weight Bearing Restrictions: No  ? ?  ? ?Mobility Bed Mobility ?Overal bed mobility: Modified Independent ?  ?  ?  ?  ?  ?  ?  ?  ? ?Transfers ?Overall transfer level: Needs assistance ?Equipment used: Rolling walker (2 wheels) ?Transfers: Sit to/from Stand, Bed to chair/wheelchair/BSC ?Sit to Stand: Min assist, Mod assist, +2 physical assistance, +2 safety/equipment ?  ?  ?Step pivot transfers: Min assist, Mod assist, +2 physical assistance ?  ?  ?  ?  ? ?  ?Balance Overall balance assessment: Needs assistance ?Sitting-balance support: Bilateral upper extremity supported ?Sitting balance-Leahy Scale: Good ?  ?  ?Standing balance support: Bilateral upper extremity supported ?Standing balance-Leahy Scale: Poor ?  ?  ?  ?  ?  ?  ?  ?  ?  ?  ?  ?  ?   ? ?ADL either performed or assessed with clinical judgement  ? ?ADL Overall ADL's : Needs assistance/impaired ?Eating/Feeding: Set up;Sitting ?  ?Grooming: Set up;Sitting ?  ?Upper Body Bathing: Minimal assistance;Sitting ?  ?Lower Body Bathing: Maximal assistance;Sit to/from stand;Sitting/lateral leans ?  ?Upper Body Dressing : Minimal assistance;Sitting ?  ?Lower Body Dressing: Maximal assistance;Sitting/lateral leans;Sit to/from stand ?  ?Toilet Transfer: Minimal assistance;Moderate assistance;+2 for physical assistance;Rolling walker (2 wheels);BSC/3in1 ?Toilet Transfer Details (indicate cue type and reason): step pivot simulated to chair ?Toileting- Clothing Manipulation and Hygiene: Maximal assistance ?  ?  ?  ?Functional mobility during ADLs: Minimal assistance;Moderate assistance;Rolling walker (2 wheels);+2 for physical assistance ?   ? ? ? ?Vision   ?  Vision Assessment?: No apparent visual deficits  ?   ?Perception   ?  ?Praxis   ?  ? ?Pertinent Vitals/Pain  Pain Assessment ?Pain Assessment: No/denies pain  ? ? ? ?Hand Dominance   ?  ?Extremity/Trunk Assessment Upper Extremity Assessment ?Upper Extremity Assessment: Generalized weakness (ataxic movement) ?  ?Lower Extremity Assessment ?Lower Extremity Assessment: Defer to PT evaluation ?  ?Cervical / Trunk Assessment ?Cervical / Trunk Assessment: Normal ?  ?Communication Communication ?Communication: No difficulties ?  ?Cognition Arousal/Alertness: Awake/alert ?Behavior During Therapy: [facility] for tasks assessed/performed, Impulsive ?Overall Cognitive Status: Within Functional Limits for tasks assessed ?  ?  ?  ?  ?  ?  ?  ?  ?  ?  ?  ?  ?  ?  ?  ?  ?  ?  ?  ?General Comments  SpO2 stable on 2L O2 during session, pt tachycardic up to 150's with short distance ambulation/transfers ? ?  ?Exercises   ?  ?Shoulder Instructions    ? ? ?Home Living Family/patient expects to be discharged to:: Private residence ?Living Arrangements: Alone ?Available Help at Discharge: Family;Available PRN/intermittently (mother checks on pt and does IADLs) ?Type of Home: Mobile home ?Home Access: Stairs to enter ?Entrance Stairs-Number of Steps: 4 ?  ?Home Layout: One level ?  ?  ?Bathroom Shower/Tub: Tub/shower unit ?  ?  ?  ?  ?Home Equipment: Conservation officer, nature (2 wheels);Shower seat ?  ?Additional Comments: does not use O2 at baseline ?  ? ?  ?Prior Functioning/Environment Prior Level of Function : Needs assist ?  ?  ?  ?  ?  ?  ?Mobility Comments: uses RW for amb, though not a lot of room inside trailer, and tends to furniture walk; recently with falls and difficulty walking, and would sit down and scoot around his trailer approx 50% of the time per pt ?ADLs Comments: shower seat; tends to eat microwave meals ?  ? ?  ?  ?OT Problem List: Decreased strength;Decreased range of motion;Decreased activity tolerance;Decreased coordination;Decreased knowledge of use of DME or AE;Decreased knowledge of precautions;Cardiopulmonary status limiting  activity;Impaired UE functional use ?  ?   ?OT Treatment/Interventions: Self-care/ADL training;Therapeutic exercise;DME and/or AE instruction;Therapeutic activities;Patient/family education;Balance training  ?  ?OT Goals(Current goals can be found in the care plan section) Acute Rehab OT Goals ?Patient Stated Goal: none stated ?OT Goal Formulation: With patient ?Time For Goal Achievement: 06/05/21 ?Potential to Achieve Goals: Good ?ADL Goals ?Pt Will Perform Upper Body Dressing: with min guard assist;sitting ?Pt Will Perform Lower Body Dressing: with min assist;sit to/from stand;sitting/lateral leans ?Pt Will Transfer to Toilet: with min guard assist;with +2 assist;bedside commode;stand pivot transfer ?Pt Will Perform Tub/Shower Transfer: shower seat;rolling walker;Shower transfer;Tub transfer;Stand pivot transfer;with min guard assist  ?OT Frequency: Min 2X/week ?  ? ?Co-evaluation PT/OT/SLP Co-Evaluation/Treatment: Yes ?Reason for Co-Treatment: Complexity of the patient's impairments (multi-system involvement);For patient/therapist safety;To address functional/ADL transfers ?  ?OT goals addressed during session: ADL's and self-care;Proper use of Adaptive equipment and DME ?  ? ?  ?AM-PAC OT "6 Clicks" Daily Activity     ?Outcome Measure Help from another person eating meals?: None ?Help from another person taking care of personal grooming?: A Little ?Help from another person toileting, which includes using toliet, bedpan, or urinal?: A Lot ?Help from another person bathing (including washing, rinsing, drying)?: A Lot ?Help from another person to put on and taking off regular upper body clothing?: A Little ?Help from another person to put on  and taking off regular lower body clothing?: A Lot ?6 Click Score: 16 ?  ?End of Session Equipment Utilized During Treatment: Gait belt;Rolling walker (2 wheels);Oxygen ?Nurse Communication: Mobility status ? ?Activity Tolerance: Patient tolerated treatment well ?Patient left:  in bed;with bed alarm set;with call bell/phone within reach ? ?OT Visit Diagnosis: Unsteadiness on feet (R26.81);Other abnormalities of gait and mobility (R26.89);Muscle weakness (generalized) (M62.81)  ?

## 2021-05-22 NOTE — Progress Notes (Addendum)
ANTICOAGULATION CONSULT NOTE - Follow Up Consult ? ?Pharmacy Consult for Heparin ?Indication: pulmonary embolus and DVT ? ?Allergies  ?Allergen Reactions  ? Whole Blood   ?  [facility] - declines blood or blood products, will accept fractions such as albumin ,hemoglobin  ? ? ?Patient Measurements: ?Weight: 82.4 kg (181 lb 10.5 oz) ?Heparin Dosing Weight: 82 kg ? ?Vital Signs: ?Temp: 98.5 ?F (36.9 ?C) (03/27 6967) ?Temp Source: Oral (03/27 8938) ?BP: 125/87 (03/27 0459) ?Pulse Rate: 98 (03/27 0807) ? ?Labs: ?Recent Labs  ?  05/19/21 ?1637 05/19/21 ?2129 05/20/21 ?0418 05/20/21 ?1255 05/21/21 ?0501 05/22/21 ?0243  ?HGB 6.2*  --  6.4*  --   --  6.3*  ?HCT 22.3*  --  22.7*  --   --  23.1*  ?PLT  --   --  145*  --   --  195  ?HEPARINUNFRC  --    < > 0.60 0.48 0.42 0.26*  ?CREATININE  --   --  1.12  --   --   --   ? < > = values in this interval not displayed.  ? ?Assessment: ?46 y.o. male with PE and DVTs. Recent episodes of rectal bleeding, hemorrhoidal vs diverticular. Chronic anemia, Hgb low stable, no blood products. S/p IVC filter on 3/24. Aranesp x 1 and IV iron given on 3/24; on Fe PO BID w/ meals and pantoprazole 40 mg PO daily. ? ? Heparin level had been low therapeutic x 3 on 1150 units/hr, but subtherapeutic (0.26) early this am.  No known interruptions, no bleeding reported. ? ?Goal of Therapy:  ?Heparin level 0.3-0.5 units/ml ?Monitor platelets by anticoagulation protocol: Yes ?  ?Plan:  ?Continue heparin drip at 1150 units/hr ?Repeat heparin level later this morning. ?Daily heparin level and CBC while on heparin. ?Monitor for bleeding.  ?Follow up oral anticoagulation when able. ? ?Dennie Fetters, RPh ?05/22/2021,8:15 AM ? ?Addendum: ?  Repeat heparin level at goal (0.34). ?  To transition to Eliquis 10 mg BID x 7 days then 5 mg BID. ?  Heparin drip to be stopped when giving first Eliquis dose. ? ?Hilarie Fredrickson, RPh ?05/22/2021 ?12:27 PM ? ? ?

## 2021-05-22 NOTE — Progress Notes (Addendum)
?  Progress Note ? ? ? ?05/22/2021 ?8:03 AM ?* No surgery found * ? ?Subjective:  no complaints. Sleeping when I entered room ? ? ?Vitals:  ? 05/22/21 0010 05/22/21 0459  ?BP: 130/89 125/87  ?Pulse: 95   ?Resp:  18  ?Temp: 99.3 ?F (37.4 ?C) 99.2 ?F (37.3 ?C)  ?SpO2: 100% 96%  ? ?Physical Exam: ?Cardiac:  regular ?Lungs:  non labored ?Extremities:  LLE well perfused and warm. Palpable left PT, Doppler At signal.Motor and sensation intact ?Neurologic: alert and oriented ? ?CBC ?   ?Component Value Date/Time  ? WBC 9.6 05/22/2021 0243  ? RBC 2.97 (L) 05/22/2021 0243  ? HGB 6.3 (LL) 05/22/2021 0243  ? HCT 23.1 (L) 05/22/2021 0243  ? PLT 195 05/22/2021 0243  ? MCV 77.8 (L) 05/22/2021 0243  ? MCH 21.2 (L) 05/22/2021 0243  ? MCHC 27.3 (L) 05/22/2021 0243  ? RDW 16.7 (H) 05/22/2021 0243  ? LYMPHSABS 2.8 05/19/2021 0124  ? MONOABS 1.2 (H) 05/19/2021 0124  ? EOSABS 0.0 05/19/2021 0124  ? BASOSABS 0.1 05/19/2021 0124  ? ? ?BMET ?   ?Component Value Date/Time  ? NA 138 05/20/2021 0418  ? K 3.5 05/20/2021 0418  ? CL 108 05/20/2021 0418  ? CO2 21 (L) 05/20/2021 0418  ? GLUCOSE 88 05/20/2021 0418  ? BUN 13 05/20/2021 0418  ? CREATININE 1.12 05/20/2021 0418  ? CALCIUM 8.1 (L) 05/20/2021 0418  ? GFRNONAA >60 05/20/2021 0418  ? ? ?INR ?   ?Component Value Date/Time  ? INR 1.1 05/19/2021 0124  ? ? ? ?Intake/Output Summary (Last 24 hours) at 05/22/2021 0803 ?Last data filed at 05/22/2021 0700 ?Gross per 24 hour  ?Intake 600 ml  ?Output 550 ml  ?Net 50 ml  ? ? ? ?Assessment/Plan:  46 y.o. male with asymptomatic left popliteal occlusion  ? ?Left leg remains well perfused and warm. No change in symptoms ?Continue current medical management for PE. Heparin IV ?Hgb 6.3 asymptomatic  ?Blood transfusion declined due to [place] witness ?Vascular will remain available as needed  ? ?Karoline Caldwell, PA-C ?[facility] ?[phone] ?05/22/2021 ?8:03 AM ? ?VASCULAR STAFF ADDENDUM: ?I have independently interviewed and examined the  patient. ?I agree with the above.  ?Discussed the popliteal artery thrombus. Remains asymptomatic.  ?Can follow in the outpt setting with intervention reserved for lifestyle limiting claudication, rest pain or tissue loss.  ?Will see in 1 month. Please continue anticoagulation. ? ? ?J. Melene Muller, MD ?[facility] ?Office Phone Number: [phone] ?05/23/2021 7:27 AM ? ? ?

## 2021-05-22 NOTE — TOC Benefit Eligibility Note (Signed)
Patient Advocate Encounter ? ?Insurance verification completed.   ? ?The patient is currently admitted and upon discharge could be taking Eliquis Starter Pack. ? ?The current 30 day co-pay is, $0.00.  ? ?The patient is currently admitted and upon discharge could be taking Eliquis 5 mg. ? ?The current 30 day co-pay is, $0.00.  ? ?The patient is insured through [employer] [place] Medicaid  ? ? ? ?Roland Earl, CPhT ?Pharmacy Patient Advocate Specialist ?[facility] Patient Advocate Team ?Direct Number: [phone]  Fax: [phone] ? ? ? ? ? ?  ?

## 2021-05-22 NOTE — Plan of Care (Signed)

## 2021-05-22 NOTE — TOC Initial Note (Signed)
Transition of Care (TOC) - Initial/Assessment Note  ? ? ?Patient Details  ?Name: Kenneth Cross ?MRN: [id number] ?Date of Birth: January 20, 1976 ? ?Transition of Care [facility]) CM/SW Contact:    ?Joanne Chars, LCSW ?Phone Number: ?05/22/2021, 3:06 PM ? ?Clinical Narrative:    CSW met with pt regarding DC recommendation for SNF.  Pt from [place], listed as uninsured but reports he has coverage, initially said [place], then said something else as well.  Pt does not have his card but can get his mother to send a copy of his card.  Pt would like to go to SNF, has been doing outpt PT at [facility] in [place].   ? ?1130: pt had a texted picture of insurance cards from his mother but was unable to forward them to [place].             ? ? ?Expected Discharge Plan: [place] ?Barriers to Discharge: SNF Pending bed offer, Other (must enter comment) (pt reports he has [place], also possibly other coverage) ? ? ?Patient Goals and CMS Choice ?Patient states their goals for this hospitalization and ongoing recovery are:: get back to walking with a cane ?  ?  ? ?Expected Discharge Plan and Services ?Expected Discharge Plan: [place] ?In-house Referral: Clinical Social Work ?  ?Post Acute Care Choice: [place] ?Living arrangements for the past 2 months: [place] ?                ?  ?  ?  ?  ?  ?  ?  ?  ?  ?  ? ?Prior Living Arrangements/Services ?Living arrangements for the past 2 months: [place] ?Lives with:: Self ?Patient language and need for interpreter reviewed:: Yes ?Do you feel safe going back to the place where you live?: Yes      ?Need for Family Participation in Patient Care: No (Comment) ?Care giver support system in place?: Yes (comment) ?Current home services: Other (comment) (none) ?Criminal Activity/Legal Involvement Pertinent to Current Situation/Hospitalization: No - Comment as needed ? ?Activities of Daily Living ?  ?  ? ?Permission  Sought/Granted ?Permission sought to share information with : Family Supports ?Permission granted to share information with : Yes, Verbal Permission Granted ? Share Information with NAME: mother Hilda Blades, father Laverna Peace ? Permission granted to share info w AGENCY: SNF ?   ?   ? ?Emotional Assessment ?Appearance:: Appears older than stated age ?Attitude/Demeanor/Rapport: Engaged ?Affect (typically observed): Appropriate, Pleasant ?Orientation: : Oriented to Self, Oriented to Place, Oriented to  Time, Oriented to Situation ?Alcohol / Substance Use: Not Applicable ?Psych Involvement: No (comment) ? ?Admission diagnosis:  Pulmonary embolism ([place]) [I26.99] ?Patient Active Problem List  ? Diagnosis Date Noted  ? 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 Blood transfusion declined because patient is Jehovah's Witness 05/21/2021  ? Microcytic anemia 05/21/2021  ? Hypothyroidism 05/21/2021  ? Hyperlipidemia 05/21/2021  ? Essential hypertension 05/21/2021  ? Pulmonary embolism ([place]) 05/19/2021  ? Acute deep vein thrombosis (DVT) of popliteal vein of both lower extremities (HCC)   ? LGI bleed   ? ?PCP:  Dorothyann Peng, FNP ?Pharmacy:   ?[id number] [place]?[address]?[place] [address]?Phone: [phone]: [phone]? ? ? ? ?Social Determinants of Health (SDOH) Interventions ?  ? ?Readmission Risk Interventions ?   ? View : No data to display.  ?  ?  ?  ? ? ? ?

## 2021-05-22 NOTE — Progress Notes (Signed)
?PROGRESS NOTE ? ?Kenneth Cross  MMN:[id number] DOB: Jan 05, 1976 DOA: 05/19/2021 ?PCP: Catha Nottingham, FNP  ? ?Brief Narrative: ? ?Patient is a 46 year old male with history of cerebellar degeneration causing ataxia, hypothyroidism, hypertension, seizure disorder, alcohol use, Jehovah's witness who was transferred from [facility] emergency department for the management of bilateral PE with right ventricular strain.  He initially presented there with dyspnea, chest pain.  He was admitted under ICU service.  After admission, IR, vascular surgery consulted, he underwent IVC filter placement, I started on heparin drip.  Patient transferred to our service on 3/26.  Plan to start on oral anticoagulation with Eliquis.  PT/OT recommended skilled nursing facility on discharge ? ?Assessment & Plan: ? ?Principal Problem: ?  Pulmonary embolism (HCC) ?Active Problems: ?  Acute deep vein thrombosis (DVT) of popliteal vein of both lower extremities (HCC) ?  LGI bleed ?  Cerebral ataxia (HCC) ?  Blood transfusion declined because patient is Jehovah's Witness ?  Microcytic anemia ?  Hypothyroidism ?  Hyperlipidemia ?  Essential hypertension ? ?Acute PE with right ventricular strain: Presented with dyspnea, chest pain.  Started on heparin drip on admission.  Echo done here showed EF of 65 to 70%, right ventricular pressure overload, moderately elevated pulmonary artery pressure. ?He underwent IVC filter placement by IR.  We are planning to start Eliquis.  Challenging situation to put him on Eliquis because of his chronic anemia and Jehovah's Witness status. ? ?Acute hypoxic respiratory failure: Secondary to PE.  Currently on 2 L of oxygen.  We will continue with the oxygen ? ?Microcytic anemia: Currently hemoglobin in the range of 6.  History of recent hematochezia.  Had a colonoscopy with local GI and was found to have diverticuli and polyp in the rectum.  Had a couple of episodes of rectal bleeding.  Currently does  not have any active rectal bleeding.  Suspected to be from hemorrhoidal source versus diverticular source.  No intervention planned by GI.  Continue iron supplementation. ? ?Bilateral popliteal vein DVT/left popliteal artery thrombus: IVC filter already placed.  Vascular surgery was following.  Vascular surgery recommending to continue anticoagulation. ? ?Jehovah's witness: Denies blood products.  Continue iron supplementation ? ?Hypertension: Currently blood pressure stable.  Takes lisinopril, propranolol at home, currently on hold ? ?Hyperlipidemia: Continue statin ? ?History of alcohol use with cerebral degeneration/seizure: On home oxcarbazepine, mirtazapine ? ?History of gout: On allopurinol ? ?History of hypothyroidism: Continue levothyroxine ? ?History of ataxia: Uses walker for ambulation.  PT/OT consulted, recommended skilled nursing facility but patient is uninsured so might be a difficult planning ? ? ?  ?  ? ?DVT prophylaxis:  IV heparin ? ? ?  Code Status: Full Code ? ?Family Communication: None at the bedside ? ?Patient status: Inpatient ? ?Patient is from : Home ? ?Anticipated discharge to: Home ? ?Estimated DC date: In next 2 to 3 days ? ? ?Consultants: PCCM, vascular surgery, IR ? ?Procedures: IVC filter placement ? ?Antimicrobials:  ?Anti-infectives (From admission, onward)  ? ? None  ? ?  ? ? ?Subjective: ? ?Patient seen and examined at the bedside this morning.  Sleeping when I arrived.  Denies any new complaints.  Denies any worsening shortness of breath or cough or chest pain.  We discussed about transitioning his anticoagulation to oral today.  Apparently he agrees to go to a skilled nursing facility if possible ? ?Objective: ?Vitals:  ? 05/21/21 1452 05/22/21 0010 05/22/21 0459 05/22/21 0807  ?BP: 122/84 130/89 125/87   ?  Pulse: (!) 102 95  98  ?Resp: 16  18 16   ?Temp: 99.2 ?F (37.3 ?C) 99.3 ?F (37.4 ?C) 99.2 ?F (37.3 ?C) 98.5 ?F (36.9 ?C)  ?TempSrc: Oral Oral Oral Oral  ?SpO2: 95% 100% 96%  96%  ?Weight:      ? ? ?Intake/Output Summary (Last 24 hours) at 05/22/2021 1150 ?Last data filed at 05/22/2021 0700 ?Gross per 24 hour  ?Intake 480 ml  ?Output 550 ml  ?Net -70 ml  ? ?Filed Weights  ? 05/19/21 0340 05/21/21 0500  ?Weight: 81.7 kg 82.4 kg  ? ? ?Examination: ?General exam: Overall comfortable, not in distress ?HEENT: PERRL ?Respiratory system:  no wheezes or crackles  ?Cardiovascular system: S1 & S2 heard, RRR.  ?Gastrointestinal system: Abdomen is nondistended, soft and nontender. ?Central nervous system: Alert and oriented ?Extremities: No edema, no clubbing ,no cyanosis ?Skin: No rashes, no ulcers,no icterus     ? ? ?Data Reviewed: I have personally reviewed following labs and imaging studies ? ?CBC: ?Recent Labs  ?Lab 05/19/21 ?0124 05/19/21 ?0451 05/19/21 ?1637 05/20/21 ?05/22/21 05/22/21 ?0243  ?WBC 10.1 8.6  --  8.3 9.6  ?NEUTROABS 6.0  --   --   --   --   ?HGB 7.1* 6.5* 6.2* 6.4* 6.3*  ?HCT 24.0* 22.5* 22.3* 22.7* 23.1*  ?MCV 72.3* 72.6*  --  73.9* 77.8*  ?PLT 173 147*  --  145* 195  ? ?Basic Metabolic Panel: ?Recent Labs  ?Lab 05/19/21 ?0124 05/19/21 ?0309 05/19/21 ?0451 05/20/21 ?05/22/21  ?NA 140 138 139 138  ?K 3.1* 3.8 3.5 3.5  ?CL 108 107 109 108  ?CO2 22 22 24  21*  ?GLUCOSE 143* 112* 104* 88  ?BUN 24* 22* 22* 13  ?CREATININE 1.16 1.07 1.02 1.12  ?CALCIUM 8.6* 8.5* 8.4* 8.1*  ?MG 1.6* 1.8  --   --   ?PHOS 2.2* 2.8  --   --   ? ? ? ?Recent Results (from the past 240 hour(s))  ?MRSA Next Gen by PCR, Nasal     Status: None  ? Collection Time: 05/19/21 12:44 AM  ? Specimen: Nasal Mucosa; Nasal Swab  ?Result Value Ref Range Status  ? MRSA by PCR Next Gen NOT DETECTED NOT DETECTED Final  ?  Comment: (NOTE) ?The GeneXpert MRSA Assay (FDA approved for NASAL specimens only), ?is one component of a comprehensive MRSA colonization surveillance ?program. It is not intended to diagnose MRSA infection nor to guide ?or monitor treatment for MRSA infections. ?Test performance is not FDA approved in patients less  than 2 years ?old. ?Performed at [facility], [address] [place], [address] ?[place] ?  ?  ? ?Radiology Studies: ?No results found. ? ?Scheduled Meds: ? allopurinol  300 mg Oral Daily  ? vitamin C  500 mg Oral Daily  ? atorvastatin  10 mg Oral Daily  ? Chlorhexidine Gluconate Cloth  6 each Topical Q0600  ? ferrous sulfate  325 mg Oral BID WC  ? levothyroxine  25 mcg Oral QAC breakfast  ? mouth rinse  15 mL Mouth Rinse BID  ? oxcarbazepine  600 mg Oral BID  ? pantoprazole  40 mg Oral Q1200  ? ?Continuous Infusions: ? sodium chloride 10 mL/hr at 05/20/21 1400  ? heparin 1,150 Units/hr (05/21/21 1500)  ? ? ? LOS: 3 days  ? ?05/22/21, MD ?[facility] ?P3/27/2023, 11:50 AM   ?

## 2021-05-22 NOTE — Progress Notes (Signed)
Physical Therapy Treatment ?Patient Details ?Name: Kenneth Cross ?MRN: [id number] ?DOB: 1975-08-08 ?Today's Date: 05/22/2021 ? ? ?History of Present Illness 46 yo male developed leg cramping on 3/19 (bil DVTs)  Developed dyspnea and chest pain while at PT on 3/23.  He was sent to ER in [place], [place].  Found to have b/l PE with RV strain.  Transferred to [facility] for further therapy. IVC filter placed 3/24; Hematochezia (Hemorrhoidal versus diverticular) as well; Hgb 6.4 3/26 (declines blood products); with significant PMH of with cerebellar degeneration, diverticula, HTN, gait ataxia, and seizure disorder ? ?  ?PT Comments  ? ? Pt was seen with OT today for safety and for progression of mobility on RW with two skilled persons given ataxia and safety awareness.  Pt has tendency to try to stand quickly and did a lot of instructing on safety and decisions for recovery of balance and setting limits.  Pt preferred to have his feet on the floor to sit, and had chair alarm in place for safety.  Asked nursing to have two person help with RW for return to bed, and noted to them that BP was a bit high sitting at 149/101 but standing was 154/96.  HR was 98 at rest, 144 after first walk and 152 after second walk.  Follow along for goals of PT and recommending rehab as originally planned due to his limited help for home and magnitude of ataxia and safety for all standing and walking.  ?Recommendations for follow up therapy are one component of a multi-disciplinary discharge planning process, led by the attending physician.  Recommendations may be updated based on patient status, additional functional criteria and insurance authorization. ? ?Follow Up Recommendations ? Skilled nursing-short term rehab (<3 hours/day) ?  ?  ?Assistance Recommended at Discharge Frequent or constant Supervision/Assistance  ?Patient can return home with the following A lot of help with walking and/or transfers;Assistance with cooking/housework;Help  with stairs or ramp for entrance ?  ?Equipment Recommendations ? Rolling walker (2 wheels);Wheelchair (measurements PT);Wheelchair cushion (measurements PT);BSC/3in1  ?  ?Recommendations for Other Services   ? ? ?  ?Precautions / Restrictions Precautions ?Precautions: Fall ?Precaution Comments: HR tachy; watch O2 sats ?Restrictions ?Weight Bearing Restrictions: No  ?  ? ?Mobility ? Bed Mobility ?Overal bed mobility: Needs Assistance ?Bed Mobility: Supine to Sit ?  ?  ?Supine to sit: Min guard ?  ?  ?  ?  ? ?Transfers ?Overall transfer level: Needs assistance ?Equipment used: Rolling walker (2 wheels) ?Transfers: Sit to/from Stand, Bed to chair/wheelchair/BSC ?Sit to Stand: Min assist, Mod assist, +2 physical assistance, +2 safety/equipment ?  ?Step pivot transfers: Min assist, Mod assist, +2 physical assistance ?  ?  ?  ?General transfer comment: two for power up and then 1-2 to capture balance ?  ? ?Ambulation/Gait ?Ambulation/Gait assistance: Min assist, Mod assist, +2 safety/equipment ?Gait Distance (Feet): 10 Feet ?Assistive device: Rolling walker (2 wheels), 2 person hand held assist, Pushed wheelchair ?Gait Pattern/deviations: Step-to pattern, Step-through pattern ?Gait velocity: reduced ?  ?Pre-gait activities: standing balance correction ?General Gait Details: has high stepping pattern and longer steps with difficulty controlling ataxia ? ? ?Stairs ?Stairs:  (unsafe to attempt) ?  ?  ?  ?  ? ? ?Wheelchair Mobility ?  ? ?Modified Rankin (Stroke Patients Only) ?  ? ? ?  ?Balance Overall balance assessment: Needs assistance ?Sitting-balance support: Bilateral upper extremity supported ?Sitting balance-Leahy Scale: Good ?  ?  ?Standing balance support: Bilateral upper extremity supported ?  Standing balance-Leahy Scale: Poor ?  ?  ?  ?  ?  ?  ?  ?  ?  ?  ?  ?  ?  ? ?  ?Cognition Arousal/Alertness: Awake/alert ?Behavior During Therapy: Impulsive ?Overall Cognitive Status: Within Functional Limits for tasks  assessed ?  ?  ?  ?  ?  ?  ?  ?  ?  ?  ?  ?  ?  ?  ?  ?  ?  ?  ?  ? ?  ?Exercises   ? ?  ?General Comments General comments (skin integrity, edema, etc.): O2 was 98-100% and HR was 98 at rest but up to 152 on second trip on walker for even a short distance ?  ?  ? ?Pertinent Vitals/Pain Pain Assessment ?Pain Assessment: No/denies pain  ? ? ?Home Living Family/patient expects to be discharged to:: Private residence ?Living Arrangements: Alone ?Available Help at Discharge: Family;Available PRN/intermittently (mother checks on pt and does IADLs) ?Type of Home: Mobile home ?Home Access: Stairs to enter ?  ?Entrance Stairs-Number of Steps: 4 ?  ?Home Layout: One level ?Home Equipment: Agricultural consultant (2 wheels);Shower seat ?Additional Comments: does not use O2 at baseline  ?  ?Prior Function    ?  ?  ?   ? ?PT Goals (current goals can now be found in the care plan section) Progress towards PT goals: Progressing toward goals ? ?  ?Frequency ? ? ? Min 3X/week ? ? ? ?  ?PT Plan Current plan remains appropriate  ? ? ?Co-evaluation   ?Reason for Co-Treatment: Complexity of the patient's impairments (multi-system involvement);For patient/therapist safety;To address functional/ADL transfers ?  ?OT goals addressed during session: ADL's and self-care;Proper use of Adaptive equipment and DME ?  ? ?  ?AM-PAC PT "6 Clicks" Mobility   ?Outcome Measure ? Help needed turning from your back to your side while in a flat bed without using bedrails?: None ?Help needed moving from lying on your back to sitting on the side of a flat bed without using bedrails?: A Little ?Help needed moving to and from a bed to a chair (including a wheelchair)?: A Lot ?Help needed standing up from a chair using your arms (e.g., wheelchair or bedside chair)?: A Lot ?Help needed to walk in hospital room?: A Lot ?Help needed climbing 3-5 steps with a railing? : Total ?6 Click Score: 14 ? ?  ?End of Session Equipment Utilized During Treatment: Gait  belt;Oxygen ?Activity Tolerance: Patient limited by fatigue ?Patient left: in chair;with call bell/phone within reach;with chair alarm set ?Nurse Communication: Mobility status;Precautions;Other (comment) (instructions for two person return to bed) ?PT Visit Diagnosis: Unsteadiness on feet (R26.81);Muscle weakness (generalized) (M62.81);Repeated falls (R29.6) ?  ? ? ?Time: 7124-5809 ?PT Time Calculation (min) (ACUTE ONLY): 25 min ? ?Charges:  $Gait Training: 8-22 mins  ?Ivar Drape ?05/22/2021, 6:14 PM ? ?Samul Dada, PT PhD ?Acute Rehab Dept. Number: [facility] [phone] and [facility] [phone] ? ? ?

## 2021-05-22 NOTE — [place] FL2 (Signed)
?[place] MEDICAID FL2 LEVEL OF CARE SCREENING TOOL  ?  ? ?IDENTIFICATION  ?Patient Name: ?Kenneth Cross Birthdate: 03-10-1975 Sex: male Admission Date (Current Location): ?05/19/2021  ?[place] Number: ? [place] ?  Facility and Address:  ?[facility], [address] [place], [place] [address] ?     Provider Number: ?[id number]  ?Attending Physician Name and Address:  ?Burnadette Pop, MD ? Relative Name and Phone Number:  ?Athan, Casalino Father [phone] ?   ?Current Level of Care: ?Hospital Recommended Level of Care: ?Skilled Nursing Facility Prior Approval Number: ?  ? ?Date Approved/Denied: ?  PASRR Number: ?  ? ?Discharge Plan: ?SNF ?  ? ?Current Diagnoses: ?Patient Active Problem List  ? Diagnosis Date Noted  ? Cerebral ataxia (HCC) 05/21/2021  ? Blood transfusion declined because patient is Jehovah's Witness 05/21/2021  ? Microcytic anemia 05/21/2021  ? Hypothyroidism 05/21/2021  ? Hyperlipidemia 05/21/2021  ? Essential hypertension 05/21/2021  ? Pulmonary embolism (HCC) 05/19/2021  ? Acute deep vein thrombosis (DVT) of popliteal vein of both lower extremities (HCC)   ? LGI bleed   ? ? ?Orientation RESPIRATION BLADDER Height & Weight   ?  ?Self, Time, Situation, Place ? O2 Continent Weight: 181 lb 10.5 oz (82.4 kg) ?Height:     ?BEHAVIORAL SYMPTOMS/MOOD NEUROLOGICAL BOWEL NUTRITION STATUS  ?    Continent Diet (see discharge summary)  ?AMBULATORY STATUS COMMUNICATION OF NEEDS Skin   ?Total Care Verbally Normal ?  ?  ?  ?    ?     ?     ? ? ?Personal Care Assistance Level of Assistance  ?Bathing, Feeding, Dressing Bathing Assistance: Limited assistance ?Feeding assistance: Independent ?Dressing Assistance: Limited assistance ?   ? ?Functional Limitations Info  ?Sight, Hearing, Speech Sight Info: Adequate ?Hearing Info: Adequate ?Speech Info: Adequate  ? ? ?SPECIAL CARE FACTORS FREQUENCY  ?PT (By licensed PT), OT (By licensed OT)   ?  ?PT Frequency: 5x week ?OT  Frequency: 5x week ?  ?  ?  ?   ? ? ?Contractures Contractures Info: Not present  ? ? ?Additional Factors Info  ?Code Status, Allergies Code Status Info: full ?Allergies Info: whole blood ?  ?  ?  ?   ? ?Current Medications (05/22/2021):  This is the current hospital active medication list ?Current Facility-Administered Medications  ?Medication Dose Route Frequency Provider Last Rate Last Admin  ? 0.9 %  sodium chloride infusion  250 mL Intravenous Continuous Coralyn Helling, MD 10 mL/hr at 05/20/21 1400 Infusion Verify at 05/20/21 1400  ? allopurinol (ZYLOPRIM) tablet 300 mg  300 mg Oral Daily Coralyn Helling, MD   300 mg at 05/22/21 3009  ? apixaban (ELIQUIS) tablet 10 mg  10 mg Oral BID Scarlett Presto, RPH   10 mg at 05/22/21 1257  ? Followed by  ? [START ON 05/29/2021] apixaban (ELIQUIS) tablet 5 mg  5 mg Oral BID Scarlett Presto, RPH      ? ascorbic acid (VITAMIN C) tablet 500 mg  500 mg Oral Daily Coralyn Helling, MD   500 mg at 05/22/21 2330  ? atorvastatin (LIPITOR) tablet 10 mg  10 mg Oral Daily Coralyn Helling, MD   10 mg at 05/22/21 0762  ? Chlorhexidine Gluconate Cloth 2 % PADS 6 each  6 each Topical Q0600 Coralyn Helling, MD   6 each at 05/21/21 [phone]  ? docusate sodium (COLACE) capsule 100 mg  100 mg Oral BID PRN Coralyn Helling, MD      ?  ferrous sulfate tablet 325 mg  325 mg Oral BID WC Coralyn Helling, MD   325 mg at 05/22/21 7858  ? levothyroxine (SYNTHROID) tablet 25 mcg  25 mcg Oral QAC breakfast Coralyn Helling, MD   25 mcg at 05/22/21 0559  ? MEDLINE mouth rinse  15 mL Mouth Rinse BID Coralyn Helling, MD   15 mL at 05/22/21 1044  ? mirtazapine (REMERON) tablet 15 mg  15 mg Oral QHS PRN Coralyn Helling, MD      ? Oxcarbazepine (TRILEPTAL) tablet 600 mg  600 mg Oral BID Coralyn Helling, MD   600 mg at 05/22/21 8502  ? pantoprazole (PROTONIX) EC tablet 40 mg  40 mg Oral Q1200 Coralyn Helling, MD   40 mg at 05/22/21 1257  ? polyethylene glycol (MIRALAX / GLYCOLAX) packet 17 g  17 g Oral Daily PRN Coralyn Helling, MD      ? ? ? ?Discharge  Medications: ?Please see discharge summary for a list of discharge medications. ? ?Relevant Imaging Results: ? ?Relevant Lab Results: ? ? ?Additional Information ?SSN: [id number] ? ?Lorri Frederick, LCSW ? ? ? ? ?

## 2021-05-23 LAB — CBC
HCT: 24.1 % — ABNORMAL LOW (ref 39.0–52.0)
Hemoglobin: 6.8 g/dL — CL (ref 13.0–17.0)
MCH: 22.1 pg — ABNORMAL LOW (ref 26.0–34.0)
MCHC: 28.2 g/dL — ABNORMAL LOW (ref 30.0–36.0)
MCV: 78.5 fL — ABNORMAL LOW (ref 80.0–100.0)
Platelets: 226 10*3/uL (ref 150–400)
RBC: 3.07 MIL/uL — ABNORMAL LOW (ref 4.22–5.81)
RDW: 19.7 % — ABNORMAL HIGH (ref 11.5–15.5)
WBC: 9.5 10*3/uL (ref 4.0–10.5)
nRBC: 0.6 % — ABNORMAL HIGH (ref 0.0–0.2)

## 2021-05-23 MED ORDER — ACETAMINOPHEN 325 MG PO TABS
650.0000 mg | ORAL_TABLET | Freq: Four times a day (QID) | ORAL | Status: DC | PRN
  Administered 2021-05-23 – 2021-05-25 (×3): 650 mg via ORAL
  Filled 2021-05-23 (×3): qty 2

## 2021-05-23 NOTE — Plan of Care (Signed)

## 2021-05-23 NOTE — TOC Progression Note (Signed)
Transition of Care (TOC) - Progression Note  ? ? ?Patient Details  ?Name: Kenneth Cross ?MRN: [id number] ?Date of Birth: December 27, 1975 ? ?Transition of Care (TOC) CM/SW Contact  ?Lorri Frederick, LCSW ?Phone Number: ?05/23/2021, 12:55 PM ? ?Clinical Narrative:   CSW received email with pt insurance cards, which are [employer], which is [place].  CSW spoke with Maralyn Sago at [facility] who does not have medicaid bed. CSW also spoke with [facility], who report that [place] does not pay for short term rehab.  CSW was not able to reach anyone in admissions at [facility] or [facility].  CSW update pt.  Discussed alternate options.  Pt lives on the same property as his parents, they live in the house, he lives in a trailer.  Pt does believe they can offer support but said they have just refinished their floors and he is not sure he can stay in their home currently.   ? ? ? ?Expected Discharge Plan: Skilled Nursing Facility ?Barriers to Discharge: SNF Pending bed offer, Other (must enter comment) (pt reports he has [place], also possibly other coverage) ? ?Expected Discharge Plan and Services ?Expected Discharge Plan: Skilled Nursing Facility ?In-house Referral: Clinical Social Work ?  ?Post Acute Care Choice: Skilled Nursing Facility ?Living arrangements for the past 2 months: Single Family Home ?                ?  ?  ?  ?  ?  ?  ?  ?  ?  ?  ? ? ?Social Determinants of Health (SDOH) Interventions ?  ? ?Readmission Risk Interventions ?   ? View : No data to display.  ?  ?  ?  ? ? ?

## 2021-05-23 NOTE — Progress Notes (Signed)
Nutrition Brief Note ? ?Patient identified on the Malnutrition Screening Tool (MST) Report. ? ?Admitting Dx: Pulmonary embolism (HCC) [I26.99] ?PMH: History reviewed. No pertinent past medical history. ? ?Medications:  ? vitamin C  500 mg Oral Daily  ? ferrous sulfate  325 mg Oral BID WC  ? pantoprazole  40 mg Oral Q1200  ? ?Labs: ?Recent Labs  ?Lab 05/19/21 ?0124 05/19/21 ?0309 05/19/21 ?0451 05/20/21 ?0938  ?NA 140 138 139 138  ?K 3.1* 3.8 3.5 3.5  ?CL 108 107 109 108  ?CO2 22 22 24  21*  ?BUN 24* 22* 22* 13  ?CREATININE 1.16 1.07 1.02 1.12  ?CALCIUM 8.6* 8.5* 8.4* 8.1*  ?MG 1.6* 1.8  --   --   ?PHOS 2.2* 2.8  --   --   ?GLUCOSE 143* 112* 104* 88  ? ? ?Wt Readings from Last 15 Encounters:  ?05/23/21 85 kg  ?Reviewed weight history in Care Everywhere; no significant weight changes observed. ?There is no height or weight on file to calculate BMI.  ? ?Current diet order is regular, patient is consuming approximately 75-100% of meals at this time.   ? ?No nutrition interventions warranted at this time. If nutrition issues arise, please consult RD.  ? ? ?05/25/21., MS, RD, LDN (she/her/hers) ?RD pager number and weekend/on-call pager number located in Amion. ? ? ? ?

## 2021-05-23 NOTE — Progress Notes (Signed)
?PROGRESS NOTE ? ?Kenneth Cross  [id number] DOB: 27-Oct-1975 DOA: 05/19/2021 ?PCP: Dorothyann Peng, FNP  ? ?Brief Narrative: ? ?Patient is a 46 year old male with history of cerebellar degeneration causing ataxia, hypothyroidism, hypertension, seizure disorder, alcohol use, Jehovah's witness who was transferred from [facility] emergency department for the management of bilateral PE with right ventricular strain.  He initially presented there with dyspnea, chest pain.  He was admitted under ICU service.  After admission, IR, vascular surgery consulted, he underwent IVC filter placement, I started on heparin drip.  Patient transferred to our service on 3/26.  Started on oral anticoagulation with Eliquis.  PT/OT recommended skilled nursing facility on discharge,difficult placement due to lack of insurance. ? ?Assessment & Plan: ? ?Principal Problem: ?  Pulmonary embolism ([place]) ?Active Problems: ?  Acute deep vein thrombosis (DVT) of popliteal vein of both lower extremities (HCC) ?  LGI bleed ?  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  Blood transfusion declined because patient is Jehovah's Witness ?  Microcytic anemia ?  Hypothyroidism ?  Hyperlipidemia ?  Essential hypertension ? ?Acute PE with right ventricular strain: Presented with dyspnea, chest pain.  Started on heparin drip on admission.  Echo done here showed EF of 65 to 70%, right ventricular pressure overload, moderately elevated pulmonary artery pressure. ?He underwent IVC filter placement by IR.  Discontinued heparin drip, started on Eliquis.  Challenging situation to put him on Eliquis because of his chronic anemia and Jehovah's Witness status. ? ?Acute hypoxic respiratory failure: Secondary to PE.  Currently on 2 L of oxygen.  We will continue to try to wean  the oxygen ? ?Microcytic anemia: Currently hemoglobin in the range of 6.  History of recent hematochezia.  Had a colonoscopy with local GI and was found to have diverticuli and polyp in the rectum.   Had a couple of episodes of rectal bleeding.  Currently does not have any active rectal bleeding.  Suspected to be from hemorrhoidal source versus diverticular source.  No intervention planned by GI.  Continue iron supplementation.  I think we need to minimize blood draws because of his already low hemoglobin ? ?Bilateral popliteal vein DVT/left popliteal artery thrombus: IVC filter already placed.  Vascular surgery was following.  Vascular surgery recommending to continue oral  anticoagulation. ? ?Jehovah's witness: Denies blood products.  Continue iron supplementation ? ?Hypertension: Currently blood pressure stable.  Takes lisinopril, propranolol at home, currently on hold ? ?Hyperlipidemia: Continue statin ? ?History of alcohol use with cerebral degeneration/seizure: On home oxcarbazepine, mirtazapine ? ?History of gout: On allopurinol ? ?History of hypothyroidism: Continue levothyroxine ? ?History of ataxia: Uses walker for ambulation.  PT/OT consulted, recommended skilled nursing facility but patient is uninsured so might be a difficult planning ? ? ?  ?  ? ?DVT prophylaxis:  IV heparin ?apixaban (ELIQUIS) tablet 10 mg  ?apixaban (ELIQUIS) tablet 5 mg  ? ?  Code Status: Full Code ? ?Family Communication: None at the bedside ? ?Patient status: Inpatient ? ?Patient is from : Home ? ?Anticipated discharge to: Home ? ?Estimated DC date:Not sure.  Patient is medically stable for discharge  ? ? ?Consultants: PCCM, vascular surgery, IR ? ?Procedures: IVC filter placement ? ?Antimicrobials:  ?Anti-infectives (From admission, onward)  ? ? None  ? ?  ? ? ?Subjective: ? ?Patient seen and examined at the bedside this morning.  Hemodynamically stable.  No complaints, sleeping when I arrived.  Denies any worsening cough or shortness of breath.  He continues to keep  an interest on going to SNF ? ?Objective: ?Vitals:  ? 05/22/21 [id number] 05/22/21 1958 05/23/21 0428 05/23/21 0846  ?BP:  135/88 136/83 (!) 142/95  ?Pulse: 98 97 90  84  ?Resp: 16 17 18 17   ?Temp: 98.5 ?F (36.9 ?C) 99.1 ?F (37.3 ?C) 98.7 ?F (37.1 ?C) 98.7 ?F (37.1 ?C)  ?TempSrc: Oral Oral Oral Oral  ?SpO2: 96% 100% 100% 98%  ?Weight:    85 kg  ? ? ?Intake/Output Summary (Last 24 hours) at 05/23/2021 1110 ?Last data filed at 05/23/2021 [id number] ?Gross per 24 hour  ?Intake --  ?Output 950 ml  ?Net -950 ml  ? ?Filed Weights  ? 05/19/21 0340 05/21/21 0500 05/23/21 0846  ?Weight: 81.7 kg 82.4 kg 85 kg  ? ? ?Examination: ?General exam: Overall comfortable, not in distress ?HEENT: PERRL ?Respiratory system:  no wheezes or crackles  ?Cardiovascular system: S1 & S2 heard, RRR.  ?Gastrointestinal system: Abdomen is nondistended, soft and nontender. ?Central nervous system: Alert and oriented ?Extremities: No edema, no clubbing ,no cyanosis ?Skin: No rashes, no ulcers,no icterus   ? ? ?Data Reviewed: I have personally reviewed following labs and imaging studies ? ?CBC: ?Recent Labs  ?Lab 05/19/21 ?0124 05/19/21 ?0451 05/19/21 ?1637 05/20/21 ?[id number] 05/22/21 ?[id number] 05/23/21 ?[id number]  ?WBC 10.1 8.6  --  8.3 9.6 9.5  ?NEUTROABS 6.0  --   --   --   --   --   ?HGB 7.1* 6.5* 6.2* 6.4* 6.3* 6.8*  ?HCT 24.0* 22.5* 22.3* 22.7* 23.1* 24.1*  ?MCV 72.3* 72.6*  --  73.9* 77.8* 78.5*  ?PLT 173 147*  --  145* 195 226  ? ?Basic Metabolic Panel: ?Recent Labs  ?Lab 05/19/21 ?0124 05/19/21 ?0309 05/19/21 ?0451 05/20/21 ?[id number]  ?NA 140 138 139 138  ?K 3.1* 3.8 3.5 3.5  ?CL 108 107 109 108  ?CO2 22 22 24  21*  ?GLUCOSE 143* 112* 104* 88  ?BUN 24* 22* 22* 13  ?CREATININE 1.16 1.07 1.02 1.12  ?CALCIUM 8.6* 8.5* 8.4* 8.1*  ?MG 1.6* 1.8  --   --   ?PHOS 2.2* 2.8  --   --   ? ? ? ?Recent Results (from the past 240 hour(s))  ?MRSA Next Gen by PCR, Nasal     Status: None  ? Collection Time: 05/19/21 12:44 AM  ? Specimen: Nasal Mucosa; Nasal Swab  ?Result Value Ref Range Status  ? MRSA by PCR Next Gen NOT DETECTED NOT DETECTED Final  ?  Comment: (NOTE) ?The GeneXpert MRSA Assay (FDA approved for NASAL specimens only), ?is one  component of a comprehensive MRSA colonization surveillance ?program. It is not intended to diagnose MRSA infection nor to guide ?or monitor treatment for MRSA infections. ?Test performance is not FDA approved in patients less than 2 years ?old. ?Performed at [facility], [place] [address] [place], [place] ?[address] ?  ?  ? ?Radiology Studies: ?No results found. ? ?Scheduled Meds: ? allopurinol  300 mg Oral Daily  ? apixaban  10 mg Oral BID  ? Followed by  ? [START ON 05/29/2021] apixaban  5 mg Oral BID  ? vitamin C  500 mg Oral Daily  ? atorvastatin  10 mg Oral Daily  ? Chlorhexidine Gluconate Cloth  6 each Topical Q0600  ? ferrous sulfate  325 mg Oral BID WC  ? levothyroxine  25 mcg Oral QAC breakfast  ? mouth rinse  15 mL Mouth Rinse BID  ? oxcarbazepine  600 mg Oral BID  ?  pantoprazole  40 mg Oral Q1200  ? ?Continuous Infusions: ? sodium chloride 10 mL/hr at 05/20/21 1400  ? ? ? LOS: 4 days  ? ?Shelly Coss, MD ?[facility] ?P3/28/2023, 11:10 AM   ?

## 2021-05-23 NOTE — Progress Notes (Signed)
Mobility Specialist: Progress Note ? ? 05/23/21 1709  ?Mobility  ?Activity Refused mobility  ? ?Pt refused mobility with c/o pain from sores on RLE with any movement. Will f/u as able.  ? ?Kenneth Cross ?Mobility Specialist ?Mobility Specialist [place]: [phone] ?Mobility Specialist [place]: [phone] ? ?

## 2021-05-24 ENCOUNTER — Inpatient Hospital Stay (HOSPITAL_COMMUNITY): Payer: Medicaid - Out of State

## 2021-05-24 DIAGNOSIS — D509 Iron deficiency anemia, unspecified: Secondary | ICD-10-CM

## 2021-05-24 DIAGNOSIS — R531 Weakness: Secondary | ICD-10-CM

## 2021-05-24 DIAGNOSIS — J9601 Acute respiratory failure with hypoxia: Secondary | ICD-10-CM | POA: Diagnosis present

## 2021-05-24 DIAGNOSIS — Z8739 Personal history of other diseases of the musculoskeletal system and connective tissue: Secondary | ICD-10-CM

## 2021-05-24 LAB — GLUCOSE, CAPILLARY: Glucose-Capillary: 90 mg/dL (ref 70–99)

## 2021-05-24 IMAGING — MR MR HEAD W/O CM
12 of 13 series · 44 of 48 positions shown · non-contrast
Comparison: None.

CLINICAL DATA: Neuro deficit, stroke suspected, numbness in left
arm and leg

EXAM:
MRI HEAD WITHOUT CONTRAST
TECHNIQUE: Multiplanar, multiecho pulse sequences of the brain and surrounding
structures were obtained without intravenous contrast.

[Series 5: DWI · coronal · 4.0mm · 0.88mm/px · 6 of 64 slices shown (1 of 4)]
[im 1/64]
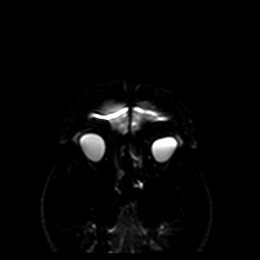
[im 13/64]
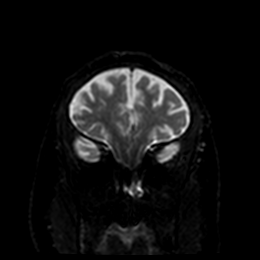
[im 26/64]
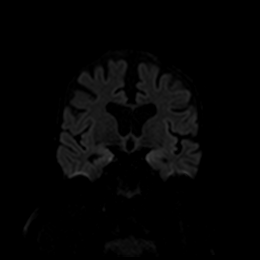
[im 38/64]
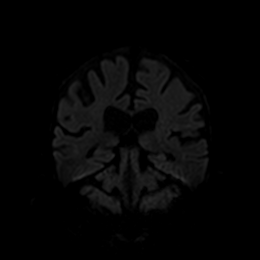
[im 51/64]
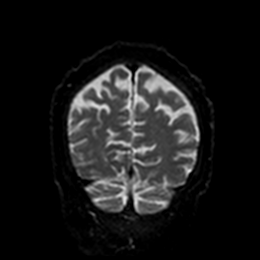
[im 64/64]
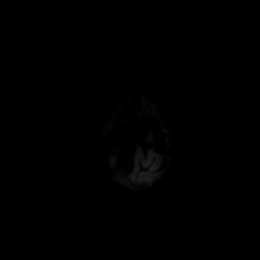

[Series 6: DWI · coronal · 4.0mm · 0.88mm/px · 2 of 31 slices shown (2 of 4)]
[im 1/31]
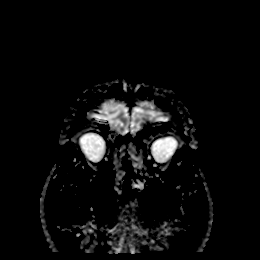
[im 31/31]
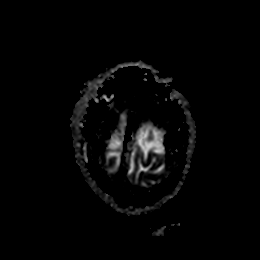

[Series 7: DWI · axial · 3.0mm · 0.88mm/px · z∈[-135,+18]mm · 8 of 104 slices shown (3 of 4)]
[im 1/104]
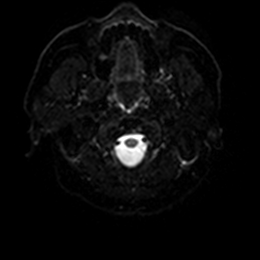
[im 15/104]
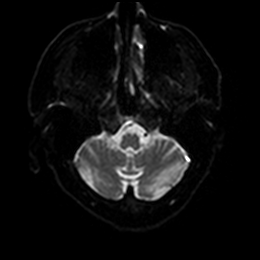
[im 30/104]
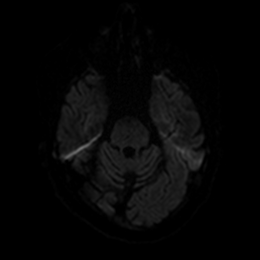
[im 45/104]
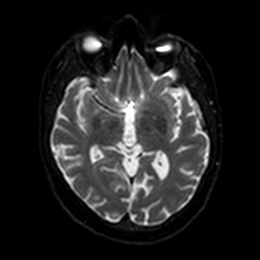
[im 59/104]
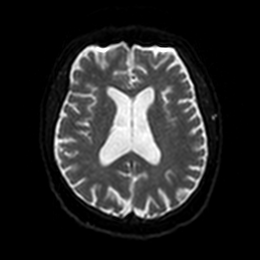
[im 74/104]
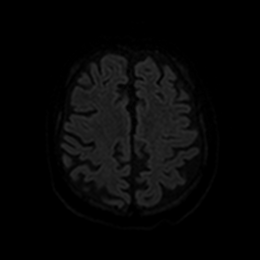
[im 89/104]
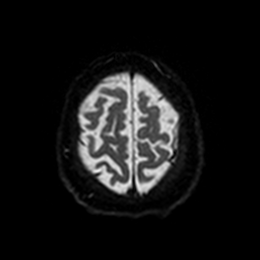
[im 104/104]
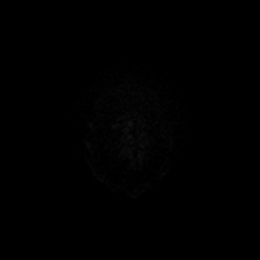

[Series 8: DWI · axial · 3.0mm · 0.88mm/px · z∈[-135,+18]mm · 4 of 52 slices shown (4 of 4)]
[im 1/52]
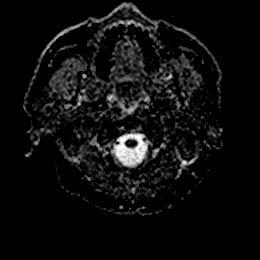
[im 18/52]
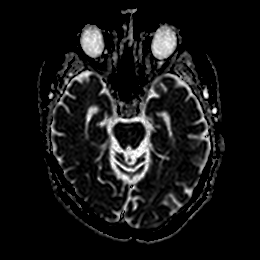
[im 35/52]
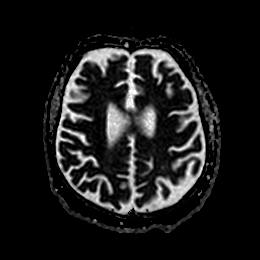
[im 52/52]
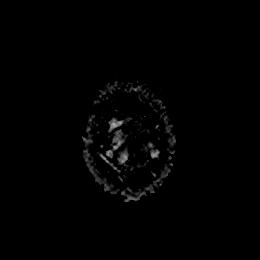

[Series 9: T1 · sagittal · 5.0mm · 0.75mm/px · 2 of 23 slices shown]
[im 1/23]
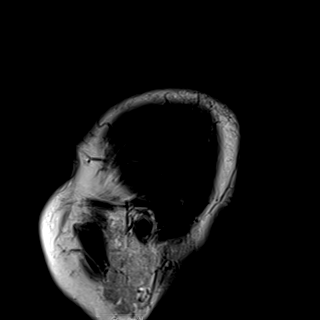
[im 23/23]
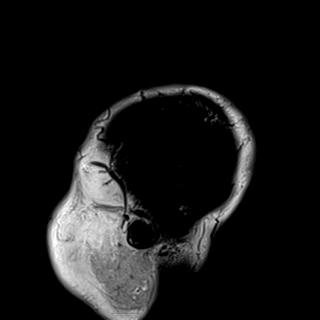

[Series 10: T2 · axial · 5.0mm · 0.72mm/px · z∈[-144,+30]mm · 2 of 30 slices shown (1 of 2)]
[im 1/30]
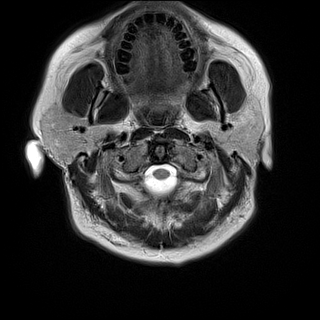
[im 30/30]
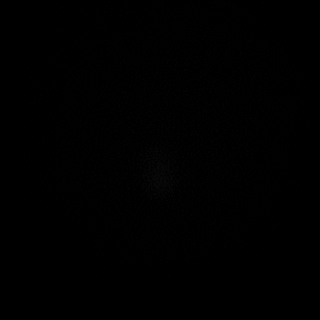

[Series 11: FLAIR · axial · 5.0mm · 0.45mm/px · z∈[-144,+30]mm · 2 of 30 slices shown]
[im 1/30]
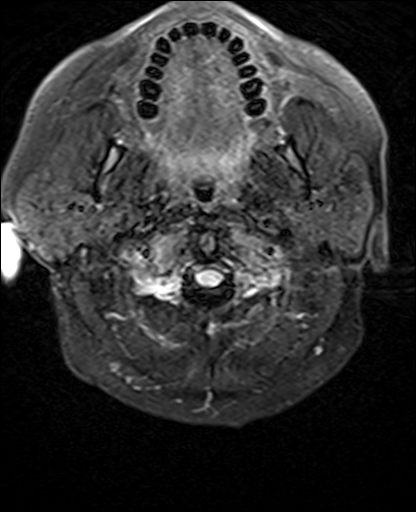
[im 30/30]
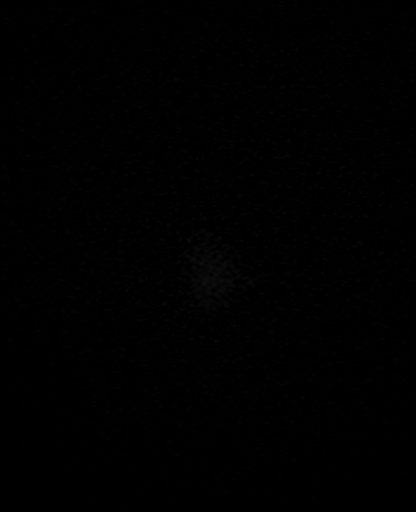

[Series 12: mag_images · axial · 3.0mm · 0.90mm/px · z∈[-129,+48]mm · 4 of 60 slices shown]
[im 1/60]
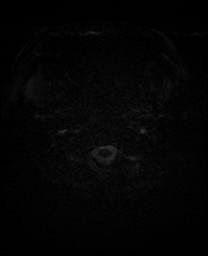
[im 20/60]
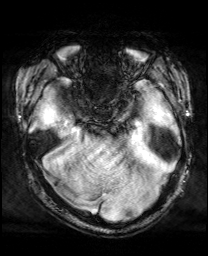
[im 40/60]
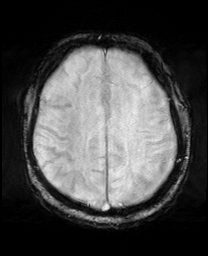
[im 60/60]
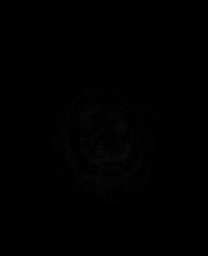

[Series 13: pha_images · axial · 3.0mm · 0.90mm/px · z∈[-129,+45]mm · 4 of 59 slices shown]
[im 1/59]
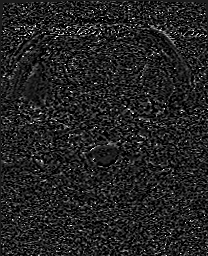
[im 20/59]
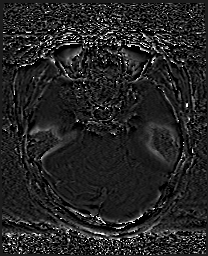
[im 39/59]
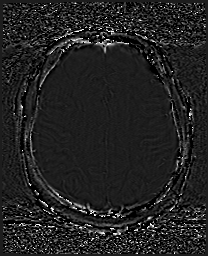
[im 59/59]
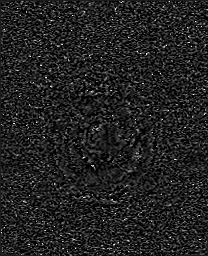

[Series 14: swi_images · axial · 3.0mm · 0.90mm/px · z∈[-129,+48]mm · 4 of 60 slices shown]
[im 1/60]
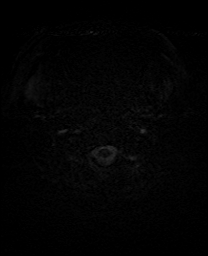
[im 20/60]
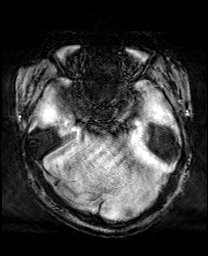
[im 40/60]
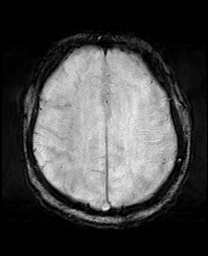
[im 60/60]
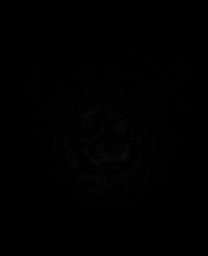

[Series 15: mip_images(sw) · axial · 24.0mm · 0.90mm/px · z∈[-118,+37]mm · 4 of 53 slices shown]
[im 1/53]
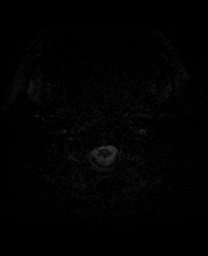
[im 18/53]
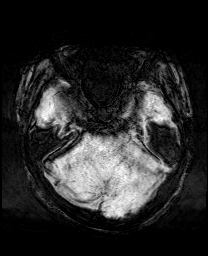
[im 35/53]
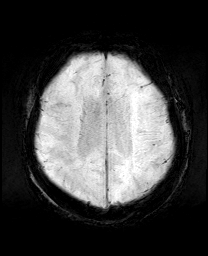
[im 53/53]
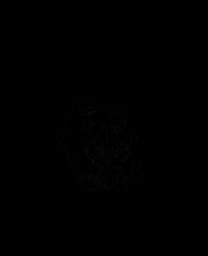

[Series 17: T2 · coronal · 5.0mm · 0.34mm/px · 2 of 29 slices shown (2 of 2)]
[im 1/29]
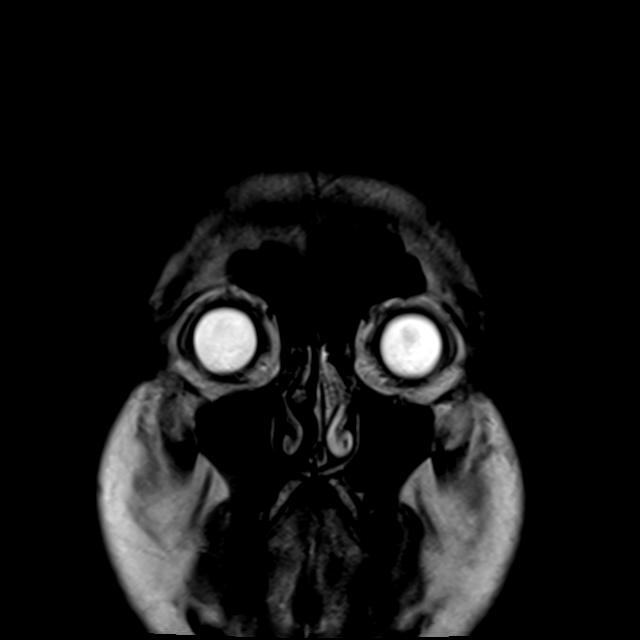
[im 29/29]
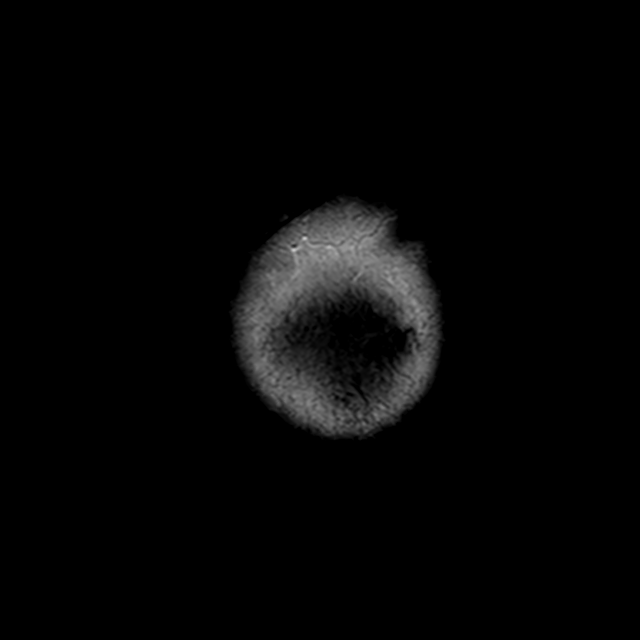

[44 of 48 positions shown; findings below may reference images not displayed]

FINDINGS: Brain: No restricted diffusion to suggest acute or subacute infarct.
No acute hemorrhage, mass, mass effect, or midline shift. No
hydrocephalus or extra-axial collection.

Vascular: Normal flow voids.

Skull and upper cervical spine: Normal marrow signal.

Sinuses/Orbits: Minimal mucosal thickening in the ethmoid air cells.
The orbits are unremarkable.

Other: Trace fluid in the left mastoid air cells.
IMPRESSION: No acute intracranial process.

## 2021-05-24 IMAGING — MR MR CERVICAL SPINE W/O CM
4 of 5 series · 19 of 48 positions shown · non-contrast
Comparison: None.

CLINICAL DATA: Cervical radiculopathy.  Left-sided paresthesia.

EXAM:
MRI CERVICAL SPINE WITHOUT CONTRAST
TECHNIQUE: Multiplanar, multisequence MR imaging of the cervical spine was
performed. No intravenous contrast was administered.

[Series 4: T2 · sagittal · 3.0mm · 0.43mm/px · 6 of 14 slices shown (1 of 2)]
[im 1/14]
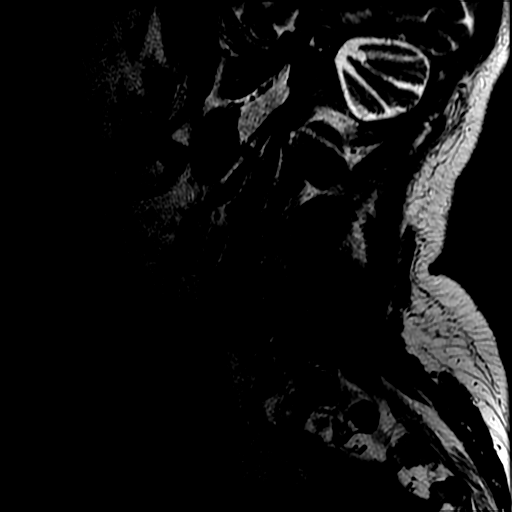
[im 3/14]
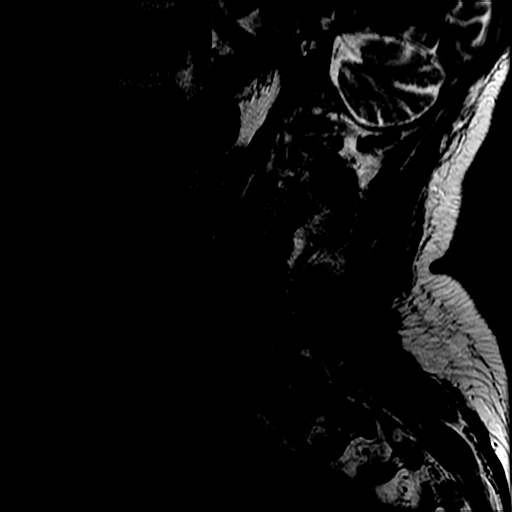
[im 6/14]
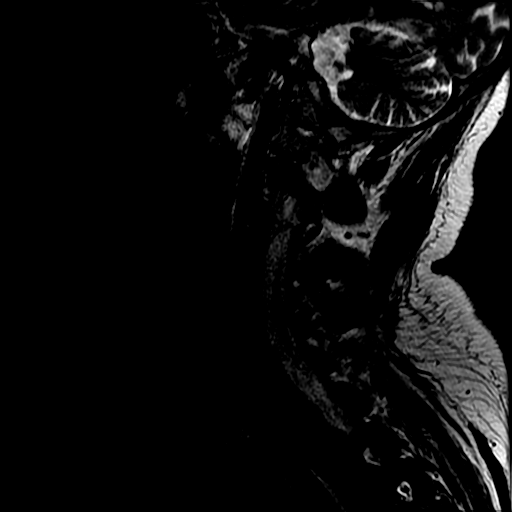
[im 8/14]
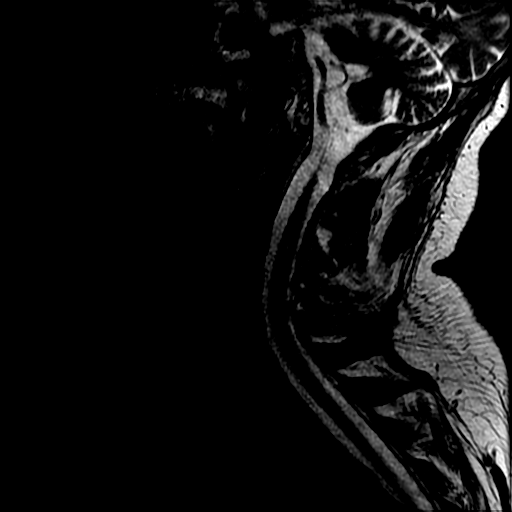
[im 11/14]
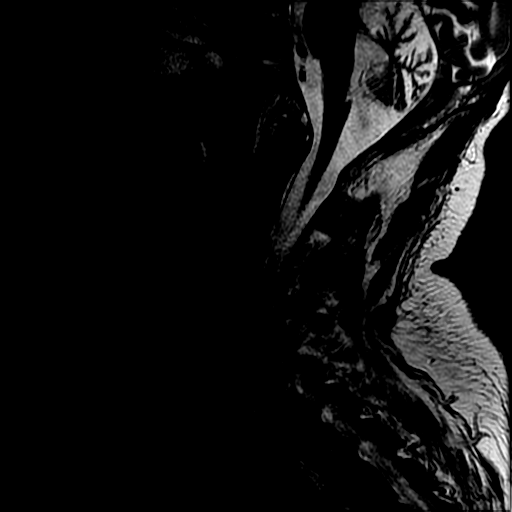
[im 14/14]
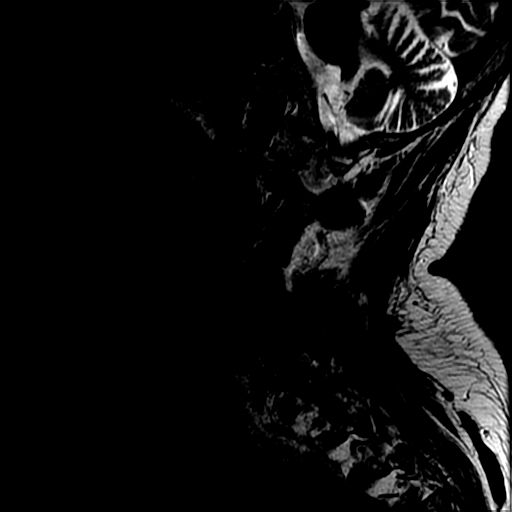

[Series 5: T1 · sagittal · 3.0mm · 0.43mm/px · 3 of 14 slices shown]
[im 3/14]
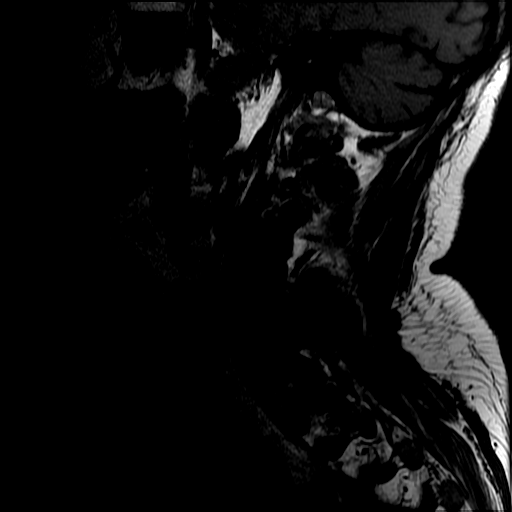
[im 7/14]
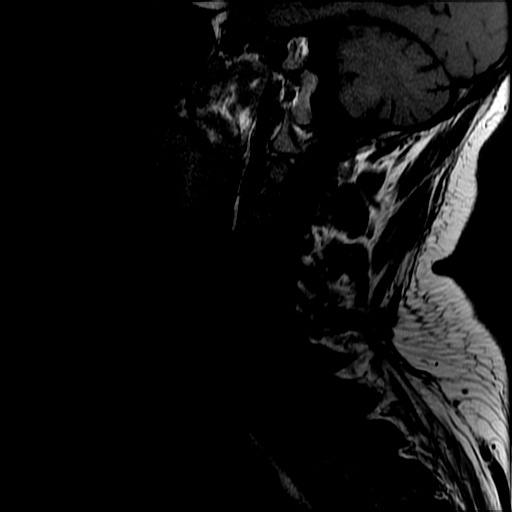
[im 11/14]
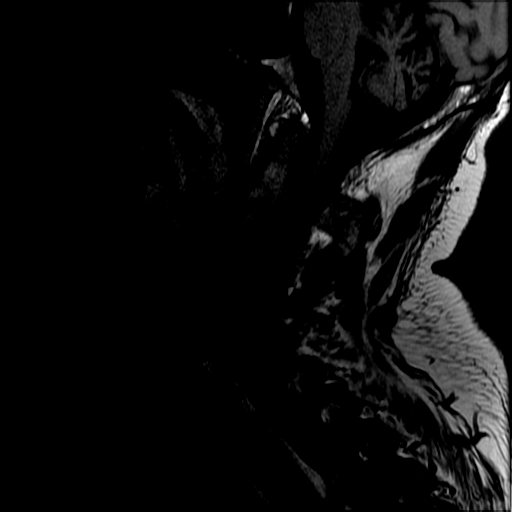

[Series 6: sag ir · sagittal · 3.0mm · 0.43mm/px · 3 of 14 slices shown]
[im 3/14]
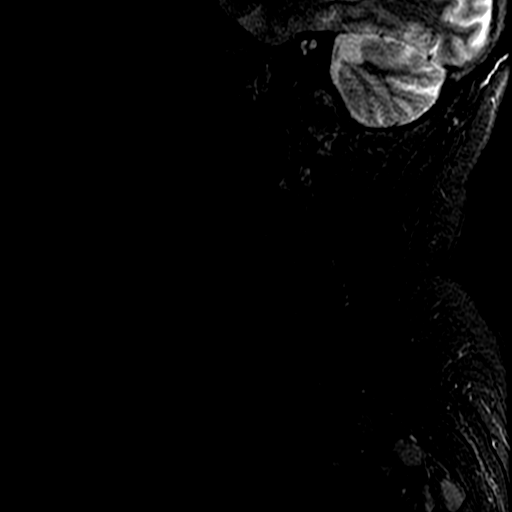
[im 7/14]
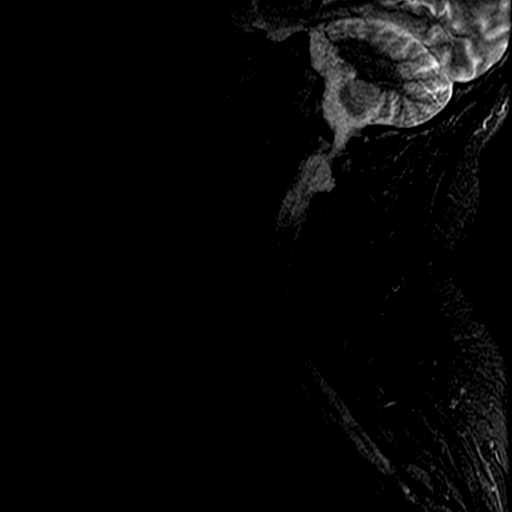
[im 11/14]
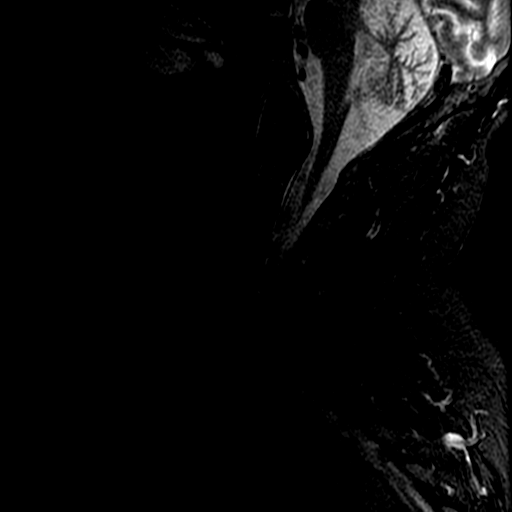

[Series 8: T2 · axial · 3.0mm · 0.39mm/px · z∈[-76,+2]mm · 7 of 30 slices shown (2 of 2)]
[im 1/30]
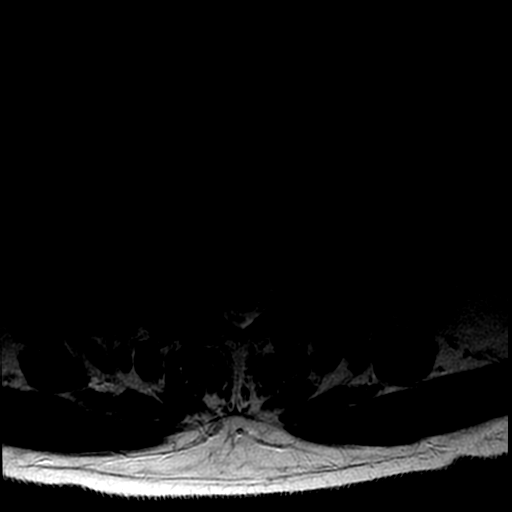
[im 5/30]
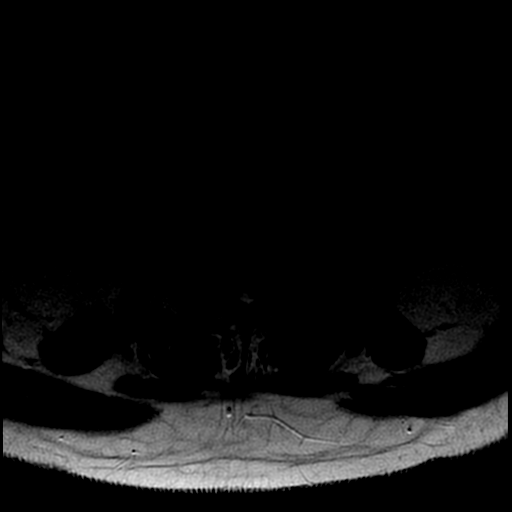
[im 9/30]
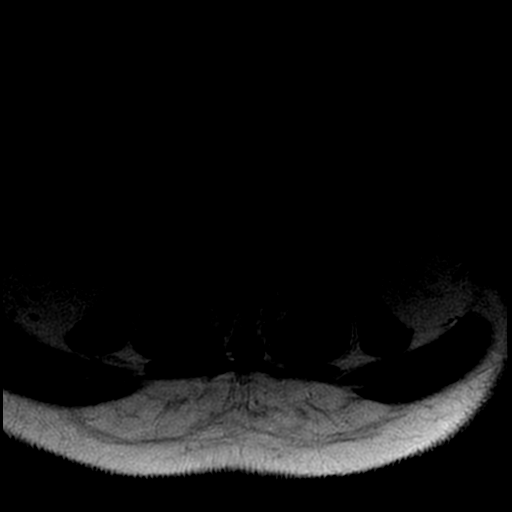
[im 14/30]
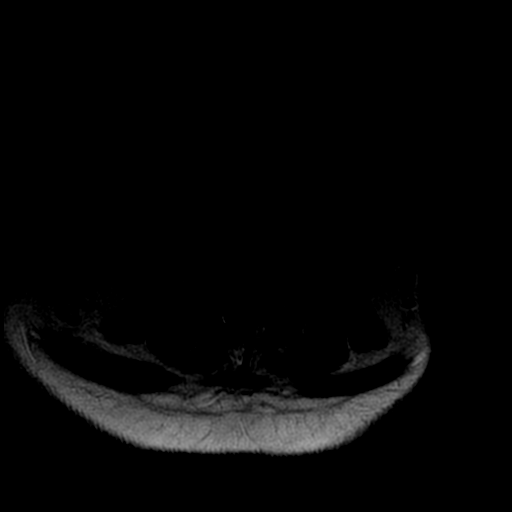
[im 16/30]
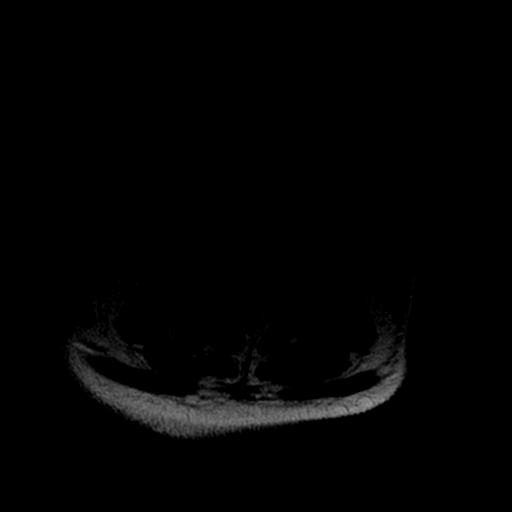
[im 21/30]
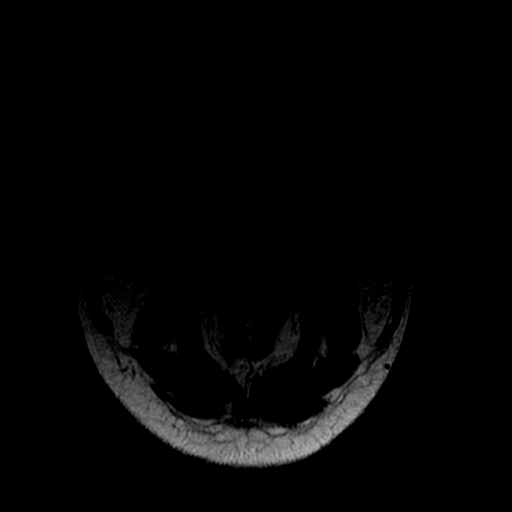
[im 25/30]
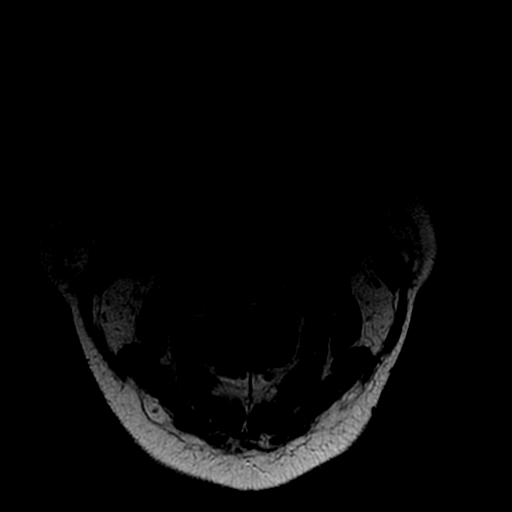

[19 of 48 positions shown; findings below may reference images not displayed]

FINDINGS: Alignment: Normal

Vertebrae: Normal bone marrow. Negative for fracture or mass. Small
hemangioma C6 vertebral body.

Cord: Cord evaluation limited due to patient motion and suboptimal
image quality. Allowing for this, no cord abnormality identified.

Posterior Fossa, vertebral arteries, paraspinal tissues: Negative

Disc levels:

C2-3: Negative

C3-4: Negative

C4-5 negative

C5-6: Mild foraminal narrowing bilaterally due to uncinate spurring
and mild facet degeneration

C6-7: Negative

C7-T1:
IMPRESSION: Suboptimal image quality.

Mild foraminal narrowing bilaterally at C5-6 due to spurring.

## 2021-05-24 NOTE — Assessment & Plan Note (Addendum)
Declines blood transfusion. ?

## 2021-05-24 NOTE — Progress Notes (Signed)
Rapid response RN-Michelle, notified for additional assessment of patient. Stroke assessment performed, vital signs rechecked and blood glucose checked. Per Michelle,RN, assessment does not indicate stroke, However rapid response team will be notified to continue to monitor throughout the day.  ?

## 2021-05-24 NOTE — Assessment & Plan Note (Signed)
Continue allopurinol 

## 2021-05-24 NOTE — Progress Notes (Signed)
Occupational Therapy Treatment ?Patient Details ?Name: Kenneth Cross ?MRN: [id number] ?DOB: November 06, 1975 ?Today's Date: 05/24/2021 ? ? ?History of present illness 46 yo male developed leg cramping on 3/19 (bil DVTs)  Developed dyspnea and chest pain while at PT on 3/23.  He was sent to ER in [place], [place].  Found to have b/l PE with RV strain.  Transferred to [facility] for further therapy. IVC filter placed 3/24; Hematochezia (Hemorrhoidal versus diverticular) as well; Hgb 6.4 3/26 (declines blood products); with significant PMH of with cerebellar degeneration, diverticula, HTN, gait ataxia, and seizure disorder ?  ?OT comments ? Pt progressing towards goals this session, able to complete LB dressing with max A, UB dressing with min A, and min A for transfers/in-room ambulation. Pt motivated to mobilize however is tachycardic which prevented pt from walking further distances in room. Pt able to walk room distance x2 during session with seated rest break. Pt presenting with impairments listed below, will follow acutely. Continue to recommend SNF at d/c.  ? ?Recommendations for follow up therapy are one component of a multi-disciplinary discharge planning process, led by the attending physician.  Recommendations may be updated based on patient status, additional functional criteria and insurance authorization. ?   ?Follow Up Recommendations ? Skilled nursing-short term rehab (<3 hours/day)  ?  ?Assistance Recommended at Discharge Intermittent Supervision/Assistance  ?Patient can return home with the following ? A lot of help with bathing/dressing/bathroom;A lot of help with walking and/or transfers;Assistance with cooking/housework;Assist for transportation;Help with stairs or ramp for entrance ?  ?Equipment Recommendations ? BSC/3in1  ?  ?Recommendations for Other Services   ? ?  ?Precautions / Restrictions Precautions ?Precautions: Fall ?Precaution Comments: HR tachy; watch O2 sats ?Restrictions ?Weight Bearing  Restrictions: No  ? ? ?  ? ?Mobility Bed Mobility ?Overal bed mobility: Needs Assistance ?Bed Mobility: Supine to Sit ?  ?  ?Supine to sit: Supervision ?  ?  ?General bed mobility comments: increased time, but no assist needed ?  ? ?Transfers ?Overall transfer level: Needs assistance ?Equipment used: Rolling walker (2 wheels) ?Transfers: Sit to/from Stand, Bed to chair/wheelchair/BSC ?Sit to Stand: Min assist, +2 physical assistance ?  ?  ?  ?  ?  ?General transfer comment: one for power up and then 1-2 to capture balance ?  ?  ?Balance Overall balance assessment: Needs assistance ?Sitting-balance support: Bilateral upper extremity supported ?Sitting balance-Leahy Scale: Good ?  ?  ?Standing balance support: Bilateral upper extremity supported ?Standing balance-Leahy Scale: Poor ?  ?  ?  ?  ?  ?  ?  ?  ?  ?  ?  ?  ?   ? ?ADL either performed or assessed with clinical judgement  ? ?ADL Overall ADL's : Needs assistance/impaired ?  ?  ?  ?  ?  ?  ?  ?  ?Upper Body Dressing : Minimal assistance;Sitting ?Upper Body Dressing Details (indicate cue type and reason): dons gown ?Lower Body Dressing: Maximal assistance;Sitting/lateral leans;Sit to/from stand ?Lower Body Dressing Details (indicate cue type and reason): to don socks ?Toilet Transfer: Minimal assistance;+2 for physical assistance;Rolling walker (2 wheels);Ambulation ?Toilet Transfer Details (indicate cue type and reason): simulated in room ?  ?  ?  ?  ?Functional mobility during ADLs: Minimal assistance;+2 for physical assistance;Rolling walker (2 wheels) ?  ?  ? ?Extremity/Trunk Assessment Upper Extremity Assessment ?Upper Extremity Assessment: Generalized weakness;LUE deficits/detail (ataxic movements) ?LUE Deficits / Details: reports increased weakness/numbness ?LUE Sensation: decreased light touch;decreased proprioception ?  ?Lower  Extremity Assessment ?Lower Extremity Assessment: Defer to PT evaluation (reporting decreased sensation in LLE) ?  ?  ?   ? ?Vision   ?Vision Assessment?: No apparent visual deficits ?  ?Perception Perception ?Perception: Not tested ?  ?Praxis Praxis ?Praxis: Not tested ?  ? ?Cognition Arousal/Alertness: Awake/alert ?Behavior During Therapy: Impulsive ?Overall Cognitive Status: Within Functional Limits for tasks assessed ?  ?  ?  ?  ?  ?  ?  ?  ?  ?  ?  ?  ?  ?  ?  ?  ?  ?  ?  ?   ?Exercises   ? ?  ?Shoulder Instructions   ? ? ?  ?General Comments tachycardic during short distance mobility with HR in 150's  ? ? ?Pertinent Vitals/ Pain       Pain Assessment ?Pain Assessment: No/denies pain ? ?Home Living   ?  ?  ?  ?  ?  ?  ?  ?  ?  ?  ?  ?  ?  ?  ?  ?  ?  ?  ? ?  ?Prior Functioning/Environment    ?  ?  ?  ?   ? ?Frequency ? Min 3X/week  ? ? ? ? ?  ?Progress Toward Goals ? ?OT Goals(current goals can now be found in the care plan section) ? Progress towards OT goals: Progressing toward goals ? ?Acute Rehab OT Goals ?Patient Stated Goal: to get stronger ?OT Goal Formulation: With patient ?Time For Goal Achievement: 06/05/21 ?Potential to Achieve Goals: Good ?ADL Goals ?Pt Will Perform Upper Body Dressing: with min guard assist;sitting ?Pt Will Perform Lower Body Dressing: with min assist;sit to/from stand;sitting/lateral leans ?Pt Will Transfer to Toilet: with min guard assist;with +2 assist;bedside commode;stand pivot transfer ?Pt Will Perform Tub/Shower Transfer: shower seat;rolling walker;Shower transfer;Tub transfer;Stand pivot transfer;with min guard assist  ?Plan Discharge plan remains appropriate;Frequency needs to be updated   ? ?Co-evaluation ? ? ?   ?  ?  ?  ?  ? ?  ?AM-PAC OT "6 Clicks" Daily Activity     ?Outcome Measure ? ? Help from another person eating meals?: None ?Help from another person taking care of personal grooming?: A Little ?Help from another person toileting, which includes using toliet, bedpan, or urinal?: A Lot ?Help from another person bathing (including washing, rinsing, drying)?: A Lot ?Help from another  person to put on and taking off regular upper body clothing?: A Little ?Help from another person to put on and taking off regular lower body clothing?: A Lot ?6 Click Score: 16 ? ?  ?End of Session Equipment Utilized During Treatment: Gait belt;Rolling walker (2 wheels) ? ?OT Visit Diagnosis: Unsteadiness on feet (R26.81);Other abnormalities of gait and mobility (R26.89);Muscle weakness (generalized) (M62.81) ?  ?Activity Tolerance Patient tolerated treatment well ?  ?Patient Left in chair;with call bell/phone within reach;with chair alarm set;with nursing/sitter in room ?  ?Nurse Communication Mobility status ?  ? ?   ? ?Time: 7412-8786 ?OT Time Calculation (min): 15 min ? ?Charges: OT General Charges ?$OT Visit: 1 Visit ?OT Treatments ?$Self Care/Home Management : 8-22 mins ? ?Alfonzo Beers, OTD, OTR/L ?Acute Rehab ?[phone] ? ? ?Mayer Masker ?05/24/2021, 1:50 PM ?

## 2021-05-24 NOTE — Assessment & Plan Note (Signed)
Seems to be stable from respiratory standpoint.  Now on room air. ?

## 2021-05-24 NOTE — Assessment & Plan Note (Addendum)
Blood pressure stable for the most part.  Occasional high readings noted.  Propranolol was resumed.  Blood pressures well controlled.  Continue to hold lisinopril. ?

## 2021-05-24 NOTE — Assessment & Plan Note (Addendum)
On 3/29 patient complained of left-sided weakness numbness and occasional shooting pain in his left arm.  MRI brain and MRI cervical spine without any acute findings.  MRI cervical spine was a suboptimal study.  Noted clear reason for his left-sided weakness is noted.  Somewhat atypical presentation.  Seen by neurology.  They did not have any further recommendations for inpatient work-up.  Could be related to peripheral neuropathy.  May need nerve conduction velocity studies as outpatient.  Discussed with patient and he understands and agrees. ?

## 2021-05-24 NOTE — Progress Notes (Signed)
Pt reports numbness in left arm and leg. Assessment performed no changes from prior at beginning of shift. Generalized weakness noted, hand grips equal, able to move all extremities upon command. Alert and oriented x4. When questioned how long he felt like this, pt states "I don't know it just felt numb when I woke up". Vital signs stable. Pt noted with history of cerebral ataxia and degeneration. James Daniels-NP notified, continue to monitor of improvement of ataxia and degeneration.  ? ?

## 2021-05-24 NOTE — Assessment & Plan Note (Signed)
Continue statin. 

## 2021-05-24 NOTE — Assessment & Plan Note (Addendum)
History of alcohol use with cerebral degeneration/seizure ? ?On oxcarbazepine ?Uses walker for ambulation.   ?PT/OT consulted, recommended skilled nursing facility but patient is uninsured so placement might be difficult.  TOC is following. ?

## 2021-05-24 NOTE — Assessment & Plan Note (Addendum)
Continue levothyroxine.  Patient mentions that he is noncompliant with his levothyroxine at home.  It could explain the elevated TSH level of 12.69.  Free T4 is noted to be normal.  We will recommend that TSH be rechecked in a few weeks.  Will not make any changes to his dose at this time.  ?

## 2021-05-24 NOTE — Progress Notes (Signed)
? ?[facility] ?PROGRESS NOTE ? ? ?Javis Aurora ZLD:[id number] DOB: Nov 23, 1975 DOA: 05/19/2021  5 ?DOS: the patient was seen and examined on 05/24/2021 ? ?PCP: Catha Nottingham, FNP ? ?Brief History and Hospital Course:  ?46 year old male with history of cerebellar degeneration causing ataxia, hypothyroidism, hypertension, seizure disorder, alcohol use, Jehovah's witness who was transferred from [facility] emergency department for the management of bilateral PE with right ventricular strain.  He initially presented there with dyspnea, chest pain.  He was admitted under ICU service.  After admission, IR, vascular surgery consulted, he underwent IVC filter placement, started on heparin drip.  Patient transferred to our service on 3/26.  Started on oral anticoagulation with Eliquis.  PT/OT recommended skilled nursing facility on discharge, difficult placement due to lack of insurance. ?Complains of left-sided numbness weakness and pain this morning involving both arm and leg.  He noted when he woke up at 3 AM. ? ?Consultants: Pulmonology, vascular surgery, interventional radiology ? ?Procedures: IVC filter placement ? ? ? ?Subjective: ?Complains of left-sided weakness.  He noted when he woke up at 3 AM this morning ? ? ? ?Assessment/Plan: ? ? ?* Pulmonary embolism (HCC) ?Presented with dyspnea, chest pain.  Started on heparin drip on admission.   ?Echo showed EF of 65 to 70%, right ventricular pressure overload, moderately elevated pulmonary artery pressure. ?He underwent IVC filter placement by IR.   ?Heparin was transitioned to Eliquis.   ?Challenging situation because of his chronic anemia and Jehovah's Witness status. ?Currently stable. ? ?Acute deep vein thrombosis (DVT) of popliteal vein of both lower extremities (HCC) ?IVC filter already placed.  Vascular surgery was following.  Vascular surgery recommending to continue oral  anticoagulation. ? ?Acute respiratory failure with hypoxia  (HCC) ?Seems to be stable from respiratory standpoint.  Now on room air. ? ?Left-sided weakness ?Patient mentioned that when he woke up at 3 AM this morning he experienced left-sided numbness pain and weakness.  Apparently did not have the symptoms when he went to bed last night.  On examination he does appear to have left-sided weakness.  He does have a history of cerebral ataxia but this is a new symptom for him.  Proceed with MRI brain.  Last known well time was last night. ? ?Microcytic anemia ?Currently hemoglobin in the range of 6.  History of recent hematochezia.  Had a colonoscopy with local GI and was found to have diverticuli and polyp in the rectum.  Had a couple of episodes of rectal bleeding.  Currently does not have any active rectal bleeding. Suspected to be from hemorrhoidal source versus diverticular source.  No intervention planned by GI.  ?Continue iron supplementation.  ?Hemoglobin low but stable.  Minimize blood draws as much as possible ? ?Blood transfusion declined because patient is Jehovah's Witness ?Declines blood transfusion ? ?Essential hypertension ?Currently blood pressure stable.  Takes lisinopril, propranolol at home, currently on hold. ? ?Hyperlipidemia ?Continue statin ? ?Cerebral ataxia (HCC) ?History of alcohol use with cerebral degeneration/seizure ? ?On oxcarbazepine ?Uses walker for ambulation.   ?PT/OT consulted, recommended skilled nursing facility but patient is uninsured so might be a difficult planning ?  ? ?Hypothyroidism ?Continue levothyroxine ? ?History of gout ?Continue allopurinol ? ? ? ?DVT Prophylaxis: On apixaban ?Code Status: Full code ?Family Communication: Discussed with patient.  No family at bedside ?Disposition Plan: SNF recommended by PT ? ?Status is: Inpatient ?Remains inpatient appropriate because: Difficult placement, left-sided weakness. ? ? ? ? ?Medications: Scheduled: ? allopurinol  300 mg Oral Daily  ? apixaban  10 mg Oral BID  ? Followed by  ?  [START ON 05/29/2021] apixaban  5 mg Oral BID  ? vitamin C  500 mg Oral Daily  ? atorvastatin  10 mg Oral Daily  ? Chlorhexidine Gluconate Cloth  6 each Topical Q0600  ? ferrous sulfate  325 mg Oral BID WC  ? levothyroxine  25 mcg Oral QAC breakfast  ? mouth rinse  15 mL Mouth Rinse BID  ? oxcarbazepine  600 mg Oral BID  ? pantoprazole  40 mg Oral Q1200  ? ?Continuous: ? sodium chloride 10 mL/hr at 05/20/21 1400  ? ?RUE:[id number]:acetaminophen, docusate sodium, mirtazapine, polyethylene glycol ? ?Antibiotics: ?Anti-infectives (From admission, onward)  ? ? None  ? ?  ? ? ?Objective: ? ?Vital Signs ? ?Vitals:  ? 05/24/21 0438 05/24/21 0500 05/24/21 0606 05/24/21 0930  ?BP: (!) 141/99  (!) 151/100 (!) 148/99  ?Pulse: 92  70 90  ?Resp:   17 17  ?Temp: 98.4 ?F (36.9 ?C)  98.7 ?F (37.1 ?C) 98.4 ?F (36.9 ?C)  ?TempSrc: Oral  Oral   ?SpO2: 95%  96% 96%  ?Weight:  80.5 kg    ? ? ?Intake/Output Summary (Last 24 hours) at 05/24/2021 0943 ?Last data filed at 05/23/2021 2148 ?Gross per 24 hour  ?Intake 300 ml  ?Output 900 ml  ?Net -600 ml  ? ?Filed Weights  ? 05/21/21 0500 05/23/21 0846 05/24/21 0500  ?Weight: 82.4 kg 85 kg 80.5 kg  ? ? ?General appearance: Awake alert.  In no distress ?Resp: Clear to auscultation bilaterally.  Normal effort ?Cardio: S1-S2 is normal regular.  No S3-S4.  No rubs murmurs or bruit ?GI: Abdomen is soft.  Nontender nondistended.  Bowel sounds are present normal.  No masses organomegaly ?Extremities: No edema.  Full range of motion of lower extremities. ?Neurologic: Alert and oriented x3.  No cranial nerve deficits appreciated.  He does have weakness of the left upper and lower extremities.  Strength is 4 out of 5. ? ? ?Lab Results: ? ?Data Reviewed: I have personally reviewed labs and imaging study reports ? ?CBC: ?Recent Labs  ?Lab 05/19/21 ?0124 05/19/21 ?0451 05/19/21 ?1637 05/20/21 ?[id number] 05/22/21 ?[id number] 05/23/21 ?[id number]  ?WBC 10.1 8.6  --  8.3 9.6 9.5  ?NEUTROABS 6.0  --   --   --   --   --   ?HGB 7.1* 6.5* 6.2*  6.4* 6.3* 6.8*  ?HCT 24.0* 22.5* 22.3* 22.7* 23.1* 24.1*  ?MCV 72.3* 72.6*  --  73.9* 77.8* 78.5*  ?PLT 173 147*  --  145* 195 226  ? ? ?Basic Metabolic Panel: ?Recent Labs  ?Lab 05/19/21 ?0124 05/19/21 ?0309 05/19/21 ?0451 05/20/21 ?[id number]  ?NA 140 138 139 138  ?K 3.1* 3.8 3.5 3.5  ?CL 108 107 109 108  ?CO2 22 22 24  21*  ?GLUCOSE 143* 112* 104* 88  ?BUN 24* 22* 22* 13  ?CREATININE 1.16 1.07 1.02 1.12  ?CALCIUM 8.6* 8.5* 8.4* 8.1*  ?MG 1.6* 1.8  --   --   ?PHOS 2.2* 2.8  --   --   ? ? ?GFR: ?CrCl cannot be calculated (Unknown ideal weight.). ? ?Liver Function Tests: ?Recent Labs  ?Lab 05/19/21 ?0124  ?AST 27  ?ALT 15  ?ALKPHOS 64  ?BILITOT 0.6  ?PROT 6.2*  ?ALBUMIN 3.0*  ? ? ? ?Coagulation Profile: ?Recent Labs  ?Lab 05/19/21 ?0124  ?INR 1.1  ? ? ? ?CBG: ?Recent Labs  ?Lab 05/19/21 ?1915  05/19/21 ?2331 05/20/21 ?1962 05/20/21 ?2297 05/24/21 ?9892  ?GLUCAP 122* 89 98 89 90  ? ? ?Recent Results (from the past 240 hour(s))  ?MRSA Next Gen by PCR, Nasal     Status: None  ? Collection Time: 05/19/21 12:44 AM  ? Specimen: Nasal Mucosa; Nasal Swab  ?Result Value Ref Range Status  ? MRSA by PCR Next Gen NOT DETECTED NOT DETECTED Final  ?  Comment: (NOTE) ?The GeneXpert MRSA Assay (FDA approved for NASAL specimens only), ?is one component of a comprehensive MRSA colonization surveillance ?program. It is not intended to diagnose MRSA infection nor to guide ?or monitor treatment for MRSA infections. ?Test performance is not FDA approved in patients less than 2 years ?old. ?Performed at [facility], [address], [place], [place] ?[address] ?  ?  ? ? ?Radiology Studies: ?No results found. ? ? ? ? LOS: 5 days  ? ?Osvaldo Shipper ? ?[facility] ?Pager on [email or web address] ? ?05/24/2021, 9:43 AM ? ? ?

## 2021-05-24 NOTE — Progress Notes (Signed)
PT Cancellation Note ? ?Patient Details ?Name: Kenneth Cross ?MRN: [id number] ?DOB: 1975-07-10 ? ? ?Cancelled Treatment:    Reason Eval/Treat Not Completed: Patient at procedure or test/unavailable (MRI). Will follow-up for PT treatment as schedule permits. ? ?Ina Homes, PT, DPT ?Acute Rehabilitation Services  ?Pager [phone] ?Office [phone] ? ?[name] ?05/24/2021, 4:14 PM ?

## 2021-05-24 NOTE — Assessment & Plan Note (Addendum)
IVC filter was placed.  Vascular surgery was following.  Vascular surgery recommending to continue oral  anticoagulation. ?

## 2021-05-24 NOTE — Assessment & Plan Note (Addendum)
Presented with dyspnea, chest pain.  Started on heparin infusion on admission.   ?Echo showed EF of 65 to 70%, right ventricular pressure overload, moderately elevated pulmonary artery pressure. ?He underwent IVC filter placement by IR.   ?Heparin was transitioned to Eliquis.   ?Challenging situation because of his chronic anemia and Jehovah's Witness status. ?Respiratory status is stable.  Saturating normal on room air. ?

## 2021-05-24 NOTE — Hospital Course (Addendum)
46 year old male with history of cerebellar degeneration causing ataxia, hypothyroidism, hypertension, seizure disorder, alcohol use, Jehovah's witness who was transferred from [facility] emergency department for the management of bilateral PE with right ventricular strain.  He initially presented there with dyspnea, chest pain.  He was admitted under ICU service.  After admission, IR, vascular surgery consulted, he underwent IVC filter placement, started on heparin drip.  Patient transferred to our service on 3/26.  Started on oral anticoagulation with Eliquis.  PT/OT recommended skilled nursing facility on discharge, difficult placement due to lack of insurance. ?Subsequently on 3/29 he complained of waking up at 3 AM with left-sided weakness numbness and occasional pain in his left arm.  Neurological work-up was initiated which was unremarkable.  Seen by neurology who does not have any further recommendations.  May need outpatient nerve conduction velocity studies. ?

## 2021-05-24 NOTE — Assessment & Plan Note (Addendum)
History of recent hematochezia.  Had a colonoscopy with local GI and was found to have diverticuli and polyp in the rectum.  Had a couple of episodes of rectal bleeding.  Currently does not have any active rectal bleeding. Suspected to be from hemorrhoidal source versus diverticular source.  No intervention planned by GI.  ?Continue iron supplementation.  ?Hemoglobin low but stable.  Minimize blood draws as much as possible. ?

## 2021-05-25 DIAGNOSIS — D509 Iron deficiency anemia, unspecified: Secondary | ICD-10-CM

## 2021-05-25 DIAGNOSIS — G8929 Other chronic pain: Secondary | ICD-10-CM

## 2021-05-25 DIAGNOSIS — I2609 Other pulmonary embolism with acute cor pulmonale: Principal | ICD-10-CM

## 2021-05-25 DIAGNOSIS — R531 Weakness: Secondary | ICD-10-CM

## 2021-05-25 DIAGNOSIS — M79602 Pain in left arm: Secondary | ICD-10-CM

## 2021-05-25 DIAGNOSIS — I82433 Acute embolism and thrombosis of popliteal vein, bilateral: Secondary | ICD-10-CM

## 2021-05-25 LAB — TSH: TSH: 12.697 u[IU]/mL — ABNORMAL HIGH (ref 0.350–4.500)

## 2021-05-25 MED ORDER — OXYCODONE HCL 5 MG PO TABS
5.0000 mg | ORAL_TABLET | Freq: Four times a day (QID) | ORAL | Status: DC | PRN
  Administered 2021-05-25 – 2021-05-26 (×3): 5 mg via ORAL
  Filled 2021-05-25 (×3): qty 1

## 2021-05-25 MED ORDER — ACETAMINOPHEN 325 MG PO TABS: 650.0000 mg | ORAL_TABLET | Freq: Four times a day (QID) | ORAL | Status: DC | PRN

## 2021-05-25 MED ORDER — PROPRANOLOL HCL 10 MG PO TABS
10.0000 mg | ORAL_TABLET | Freq: Two times a day (BID) | ORAL | Status: DC
Start: 2021-05-25 — End: 2021-05-26
  Administered 2021-05-25 – 2021-05-26 (×3): 10 mg via ORAL
  Filled 2021-05-25 (×4): qty 1

## 2021-05-25 NOTE — Progress Notes (Signed)
? ?[facility] ?PROGRESS NOTE ? ? ?Kenneth Cross YFV:[id number] DOB: 04-27-1975 DOA: 05/19/2021  6 ?DOS: the patient was seen and examined on 05/25/2021 ? ?PCP: Catha Nottingham, FNP ? ?Brief History and Hospital Course:  ?46 year old male with history of cerebellar degeneration causing ataxia, hypothyroidism, hypertension, seizure disorder, alcohol use, Jehovah's witness who was transferred from [facility] emergency department for the management of bilateral PE with right ventricular strain.  He initially presented there with dyspnea, chest pain.  He was admitted under ICU service.  After admission, IR, vascular surgery consulted, he underwent IVC filter placement, started on heparin drip.  Patient transferred to our service on 3/26.  Started on oral anticoagulation with Eliquis.  PT/OT recommended skilled nursing facility on discharge, difficult placement due to lack of insurance. ?Subsequently on 3/29 he complained of waking up at 3 AM with left-sided weakness numbness and occasional pain in his left arm.  Neurological work-up was initiated. ? ?Consultants: Pulmonology, vascular surgery, interventional radiology ? ?Procedures: IVC filter placement ? ? ? ?Subjective: ?Continues to have weakness on the left side which is new per patient.  No improvement from yesterday.  Has shooting pain down his left arm. ? ? ? ?Assessment/Plan: ? ? ?* Pulmonary embolism (HCC) ?Presented with dyspnea, chest pain.  Started on heparin drip on admission.   ?Echo showed EF of 65 to 70%, right ventricular pressure overload, moderately elevated pulmonary artery pressure. ?He underwent IVC filter placement by IR.   ?Heparin was transitioned to Eliquis.   ?Challenging situation because of his chronic anemia and Jehovah's Witness status. ?Respiratory status is stable.  Saturating normal on room air. ? ?Acute deep vein thrombosis (DVT) of popliteal vein of both lower extremities (HCC) ?IVC filter already placed.   Vascular surgery was following.  Vascular surgery recommending to continue oral  anticoagulation. ? ?Acute respiratory failure with hypoxia (HCC) ?Seems to be stable from respiratory standpoint.  Now on room air. ? ?Left-sided weakness ?On 3/29 patient complained of left-sided weakness numbness and occasional shooting pain in his left arm.  MRI brain and MRI cervical spine without any acute findings.  MRI cervical spine was a suboptimal study.  Noted clear reason for his left-sided weakness is noted.  Somewhat atypical presentation.  We will request neurology to evaluate patient.   ? ?Microcytic anemia ?Currently hemoglobin in the range of 6.  History of recent hematochezia.  Had a colonoscopy with local GI and was found to have diverticuli and polyp in the rectum.  Had a couple of episodes of rectal bleeding.  Currently does not have any active rectal bleeding. Suspected to be from hemorrhoidal source versus diverticular source.  No intervention planned by GI.  ?Continue iron supplementation.  ?Hemoglobin low but stable.  Minimize blood draws as much as possible ? ?Blood transfusion declined because patient is Jehovah's Witness ?Declines blood transfusion. ? ?Essential hypertension ?Blood pressure stable for the most part.  Occasional high readings noted.  He takes lisinopril and propranolol at home which is currently on hold.  Should be able to resume at least one of them.  We will resume the propranolol.   ? ?Hyperlipidemia ?Continue statin ? ?Cerebral ataxia (HCC) ?History of alcohol use with cerebral degeneration/seizure ? ?On oxcarbazepine ?Uses walker for ambulation.   ?PT/OT consulted, recommended skilled nursing facility but patient is uninsured so placement might be difficult. ?  ? ?Hypothyroidism ?Continue levothyroxine.  Patient mentions that he is noncompliant with his levothyroxine at home.  It could explain the  elevated TSH level of 12.69 this morning.  We will check a free T4 tomorrow and determine  if dose adjustment is required. ? ?History of gout ?Continue allopurinol ? ? ? ?DVT Prophylaxis: On apixaban ?Code Status: Full code ?Family Communication: Discussed with patient.  No family at bedside ?Disposition Plan: SNF recommended by PT ? ?Status is: Inpatient ?Remains inpatient appropriate because: Difficult placement, left-sided weakness. ? ? ? ? ?Medications: Scheduled: ? allopurinol  300 mg Oral Daily  ? apixaban  10 mg Oral BID  ? Followed by  ? [START ON 05/29/2021] apixaban  5 mg Oral BID  ? vitamin C  500 mg Oral Daily  ? atorvastatin  10 mg Oral Daily  ? Chlorhexidine Gluconate Cloth  6 each Topical Q0600  ? ferrous sulfate  325 mg Oral BID WC  ? levothyroxine  25 mcg Oral QAC breakfast  ? mouth rinse  15 mL Mouth Rinse BID  ? oxcarbazepine  600 mg Oral BID  ? pantoprazole  40 mg Oral Q1200  ? propranolol  10 mg Oral BID  ? ?Continuous: ? sodium chloride 10 mL/hr at 05/20/21 1400  ? ?ZOX:[id number]:acetaminophen, docusate sodium, mirtazapine, polyethylene glycol ? ?Antibiotics: ?Anti-infectives (From admission, onward)  ? ? None  ? ?  ? ? ?Objective: ? ?Vital Signs ? ?Vitals:  ? 05/24/21 2008 05/25/21 0446 05/25/21 0500 05/25/21 0749  ?BP: (!) 140/100 (!) 132/96  137/90  ?Pulse: 96 86  90  ?Resp: 17 17  18   ?Temp: 98.8 ?F (37.1 ?C) 98.5 ?F (36.9 ?C)  98.1 ?F (36.7 ?C)  ?TempSrc:    Oral  ?SpO2: 97% 97%  97%  ?Weight:   79.4 kg   ? ? ?Intake/Output Summary (Last 24 hours) at 05/25/2021 1010 ?Last data filed at 05/25/2021 0749 ?Gross per 24 hour  ?Intake 420 ml  ?Output 850 ml  ?Net -430 ml  ? ? ?Filed Weights  ? 05/23/21 0846 05/24/21 0500 05/25/21 0500  ?Weight: 85 kg 80.5 kg 79.4 kg  ? ? ?General appearance: Awake alert.  In no distress ?Resp: Clear to auscultation bilaterally.  Normal effort ?Cardio: S1-S2 is normal regular.  No S3-S4.  No rubs murmurs or bruit ?GI: Abdomen is soft.  Nontender nondistended.  Bowel sounds are present normal.  No masses organomegaly ?Extremities: No edema.   ?Neurologic: Alert and  oriented x3.  Subtle left-sided weakness noted.   ? ? ?Lab Results: ? ?Data Reviewed: I have personally reviewed labs and imaging study reports ? ?CBC: ?Recent Labs  ?Lab 05/19/21 ?0124 05/19/21 ?0451 05/19/21 ?1637 05/20/21 ?[id number] 05/22/21 ?[id number] 05/23/21 ?[id number]  ?WBC 10.1 8.6  --  8.3 9.6 9.5  ?NEUTROABS 6.0  --   --   --   --   --   ?HGB 7.1* 6.5* 6.2* 6.4* 6.3* 6.8*  ?HCT 24.0* 22.5* 22.3* 22.7* 23.1* 24.1*  ?MCV 72.3* 72.6*  --  73.9* 77.8* 78.5*  ?PLT 173 147*  --  145* 195 226  ? ? ? ?Basic Metabolic Panel: ?Recent Labs  ?Lab 05/19/21 ?0124 05/19/21 ?0309 05/19/21 ?0451 05/20/21 ?[id number]  ?NA 140 138 139 138  ?K 3.1* 3.8 3.5 3.5  ?CL 108 107 109 108  ?CO2 22 22 24  21*  ?GLUCOSE 143* 112* 104* 88  ?BUN 24* 22* 22* 13  ?CREATININE 1.16 1.07 1.02 1.12  ?CALCIUM 8.6* 8.5* 8.4* 8.1*  ?MG 1.6* 1.8  --   --   ?PHOS 2.2* 2.8  --   --   ? ? ? ?  GFR: ?CrCl cannot be calculated (Unknown ideal weight.). ? ?Liver Function Tests: ?Recent Labs  ?Lab 05/19/21 ?0124  ?AST 27  ?ALT 15  ?ALKPHOS 64  ?BILITOT 0.6  ?PROT 6.2*  ?ALBUMIN 3.0*  ? ? ? ? ?Coagulation Profile: ?Recent Labs  ?Lab 05/19/21 ?0124  ?INR 1.1  ? ? ? ? ?CBG: ?Recent Labs  ?Lab 05/19/21 ?1915 05/19/21 ?2331 05/20/21 ?7829 05/20/21 ?5621 05/24/21 ?3086  ?GLUCAP 122* 89 98 89 90  ? ? ? ?Recent Results (from the past 240 hour(s))  ?MRSA Next Gen by PCR, Nasal     Status: None  ? Collection Time: 05/19/21 12:44 AM  ? Specimen: Nasal Mucosa; Nasal Swab  ?Result Value Ref Range Status  ? MRSA by PCR Next Gen NOT DETECTED NOT DETECTED Final  ?  Comment: (NOTE) ?The GeneXpert MRSA Assay (FDA approved for NASAL specimens only), ?is one component of a comprehensive MRSA colonization surveillance ?program. It is not intended to diagnose MRSA infection nor to guide ?or monitor treatment for MRSA infections. ?Test performance is not FDA approved in patients less than 2 years ?old. ?Performed at [facility], [address] [place], [place] ?[address] ?  ? ?  ? ? ?Radiology  Studies: ?MR BRAIN WO CONTRAST ? ?Result Date: 05/24/2021 ?CLINICAL DATA:  Neuro deficit, stroke suspected, numbness in left arm and leg EXAM: MRI HEAD WITHOUT CONTRAST TECHNIQUE: Multiplanar, multiecho pulse

## 2021-05-25 NOTE — Progress Notes (Signed)
Physical Therapy Treatment ?Patient Details ?Name: Kenneth Cross ?MRN: [id number] ?DOB: 1976/02/02 ?Today's Date: 05/25/2021 ? ? ?History of Present Illness 46 y.o. male admitted 05/19/21 after developing BLE cramping (3/19) and dypnea with chest pain while at PT on 3/23. Workup revealed bilateral PE with RV strain; transfer to [facility] and s/p IVC filter placement 3/24. Course complicated by hematochezia; pt declining blood products since Jehovah's Witness. Pt c/o acute onset L-side numbness waking up 3/29; brain MRI negative for acute process. PMH includes cerebellar degeneration, gait ataxia, seizure disorder, HTN. ? ?  ?PT Comments  ? ? Pt is getting up to stand and walk with better control today.  PT worked in single effort but kept a chair behind pt for control of fall risk.  Pt is aware of the need to slow down and walk with cued sequence.  Pt is getting up to move with better control of HR, and O2 sats are controlled in 90+% range for entire session.  Follow up to work on safety and control of balance, work on quality of gait and work toward more agreeable level of function to increase ease of finding a rehab placement.   ?Recommendations for follow up therapy are one component of a multi-disciplinary discharge planning process, led by the attending physician.  Recommendations may be updated based on patient status, additional functional criteria and insurance authorization. ? ?Follow Up Recommendations ? Skilled nursing-short term rehab (<3 hours/day) ?  ?  ?Assistance Recommended at Discharge Frequent or constant Supervision/Assistance  ?Patient can return home with the following A lot of help with walking and/or transfers;Assistance with cooking/housework;Help with stairs or ramp for entrance ?  ?Equipment Recommendations ? Rolling walker (2 wheels);Wheelchair (measurements PT);Wheelchair cushion (measurements PT);BSC/3in1  ?  ?Recommendations for Other Services Rehab consult ? ? ?  ?Precautions /  Restrictions Precautions ?Precautions: Fall ?Precaution Comments: HR tachy; watch O2 sats ?Restrictions ?Weight Bearing Restrictions: No  ?  ? ?Mobility ? Bed Mobility ?Overal bed mobility: Needs Assistance ?Bed Mobility: Supine to Sit ?  ?  ?Supine to sit: Min guard ?  ?  ?General bed mobility comments: more help due to hand pain ?  ? ?Transfers ?Overall transfer level: Needs assistance ?Equipment used: Rolling walker (2 wheels) ?Transfers: Sit to/from Stand, Bed to chair/wheelchair/BSC ?Sit to Stand: Min assist, Mod assist ?  ?Step pivot transfers: Min assist, Mod assist ?  ?  ?  ?General transfer comment: one person assist with belt and dense cues ?  ? ?Ambulation/Gait ?Ambulation/Gait assistance: Min assist, Mod assist ?Gait Distance (Feet): 35 Feet (15+20) ?Assistive device: 1 person hand held assist, Rolling walker (2 wheels) ?Gait Pattern/deviations: Step-to pattern, Step-through pattern ?Gait velocity: variable ?  ?Pre-gait activities: standing balance skills ?General Gait Details: more controlled excursion of steps with better turning control on walker ? ? ?Stairs ?  ?  ?  ?  ?  ? ? ?Wheelchair Mobility ?  ? ?Modified Rankin (Stroke Patients Only) ?  ? ? ?  ?Balance Overall balance assessment: Needs assistance ?Sitting-balance support: Feet supported ?Sitting balance-Leahy Scale: Good ?  ?  ?Standing balance support: Bilateral upper extremity supported, During functional activity ?Standing balance-Leahy Scale: Poor ?Standing balance comment: Pt requires cues for balance and slowing down movement ?  ?  ?  ?  ?  ?  ?  ?  ?  ?  ?  ?  ? ?  ?Cognition Arousal/Alertness: Awake/alert ?Behavior During Therapy: Impulsive ?Overall Cognitive Status: Within Functional Limits for tasks assessed ?  ?  ?  ?  ?  ?  ?  ?  ?  ?  ?  ?  ?  ?  ?  ?  ?  General Comments: pt is less impulsive today, did demonstrate some limited moments then more controlled movement ?  ?  ? ?  ?Exercises   ? ?  ?General Comments General comments  (skin integrity, edema, etc.): pt is getting up to stand and walk with HR elevation, initially walking with 142 rate at point of sitting, and second walk 132 HR ?  ?  ? ?Pertinent Vitals/Pain Pain Assessment ?Pain Assessment: Faces ?Faces Pain Scale: Hurts whole lot ?Pain Location: during spasm of pain on L hand and thumb esp ?Pain Descriptors / Indicators: Jabbing, Sharp, Shooting ?Pain Intervention(s): Monitored during session, Repositioned  ? ? ?Home Living   ?  ?  ?  ?  ?  ?  ?  ?  ?  ?   ?  ?Prior Function    ?  ?  ?   ? ?PT Goals (current goals can now be found in the care plan section) Acute Rehab PT Goals ?Patient Stated Goal: relieve L hand pain ?Progress towards PT goals: Progressing toward goals ? ?  ?Frequency ? ? ? Min 3X/week ? ? ? ?  ?PT Plan Current plan remains appropriate  ? ? ?Co-evaluation   ?  ?  ?  ?  ? ?  ?AM-PAC PT "6 Clicks" Mobility   ?Outcome Measure ? Help needed turning from your back to your side while in a flat bed without using bedrails?: None ?Help needed moving from lying on your back to sitting on the side of a flat bed without using bedrails?: None ?Help needed moving to and from a bed to a chair (including a wheelchair)?: A Lot ?Help needed standing up from a chair using your arms (e.g., wheelchair or bedside chair)?: A Lot ?Help needed to walk in hospital room?: A Lot ?Help needed climbing 3-5 steps with a railing? : Total ?6 Click Score: 15 ? ?  ?End of Session Equipment Utilized During Treatment: Gait belt;Oxygen ?Activity Tolerance: Patient limited by fatigue ?Patient left: in chair;with call bell/phone within reach;with chair alarm set ?Nurse Communication: Mobility status;Precautions ?PT Visit Diagnosis: Unsteadiness on feet (R26.81);Muscle weakness (generalized) (M62.81);Repeated falls (R29.6) ?  ? ? ?Time: 1440-1505 ?PT Time Calculation (min) (ACUTE ONLY): 25 min ? ?Charges:  $Gait Training: 8-22 mins ?$Therapeutic Activity: 8-22 mins      ?Ivar Drape ?05/25/2021, 7:22  PM ? ?Samul Dada, PT PhD ?Acute Rehab Dept. Number: [facility] [phone] and [facility] [phone] ? ? ?

## 2021-05-25 NOTE — Consult Note (Signed)
NEUROLOGY CONSULTATION NOTE  ? ?Date of service: May 25, 2021 ?Patient Name: Kenneth Cross ?MRN:  [id number] ?DOB:  1975-10-12 ?Reason for consult: "L sided numbness and weakness" ?Requesting Provider: Osvaldo Shipper, MD ? ?History of Present Illness  ?Kenneth Cross is a 46 y.o. male with a neurological history as outlined below as well as hypothyroidism, hypertension, alcohol use disorder, Jehovah's Witness.  He presented to the hospital initially with bilateral PEs with right heart strain.  He has since been stabilized and has been recommended for SNF.  Placement has been difficult to find. ? ?The patient has most recently been following with Dr. Orion Modest, neurologist at [facility].  He assessed the patient with ataxia thought to be secondary to cerebellar degeneration seen on MRI (presumably secondary to his alcohol use disorder).  He also states the patient has had seizure-like events and underwent an EEG which was negative for any epileptogenic correlate and.  These were labeled myoclonic and the patient was started on Trileptal. ? ?The patient reports that 2 days ago he developed left arm pain that was dull in nature.  He reports this progressed to a sharp and shooting pain.  He reports significant numbness in his hand but denies any tingling in the extremity.  He reports that a similar pain occurred in his leg at the same time but this got better with mobilization with PT. he reports that he has had these same set of symptoms several years ago.  He states that they lasted several weeks and then resolve spontaneously. ?  ?ROS  ? ?Constitutional Denies weight loss, fever and chills.   ?HEENT Denies changes in vision and hearing.   ?Respiratory Denies cough.   ?CV Denies palpitations and CP   ?GI Denies abdominal pain, nausea, vomiting and diarrhea.   ?GU Denies dysuria and urinary frequency.   ?MSK Denies myalgia and joint pain.   ?Skin Denies rash and pruritus.   ?Neurological Denies headache  and syncope.   ?Psychiatric    ? ?Past History  ?History reviewed. No pertinent past medical history. ?Past Surgical History:  ?Procedure Laterality Date  ?? IR IVC FILTER PLMT / S&I /IMG GUID/MOD SED  05/19/2021  ? ?History reviewed. No pertinent family history. ?Social History  ? ?Socioeconomic History  ?? Marital status: Single  ?  Spouse name: Not on file  ?? Number of children: Not on file  ?? Years of education: Not on file  ?? Highest education level: Not on file  ?Occupational History  ?? Not on file  ?Tobacco Use  ?? Smoking status: Not on file  ?? Smokeless tobacco: Not on file  ?Substance and Sexual Activity  ?? Alcohol use: Not on file  ?? Drug use: Not on file  ?? Sexual activity: Not on file  ?Other Topics Concern  ?? Not on file  ?Social History Narrative  ?? Not on file  ? ?Social Determinants of Health  ? ?Financial Resource Strain: Not on file  ?Food Insecurity: Not on file  ?Transportation Needs: Not on file  ?Physical Activity: Not on file  ?Stress: Not on file  ?Social Connections: Not on file  ? ?Allergies  ?Allergen Reactions  ?? Whole Blood   ?  Jehovah's - declines blood or blood products, will accept fractions such as albumin ,hemoglobin  ? ? ?Medications  ? ?Medications Prior to Admission  ?Medication Sig Dispense Refill Last Dose  ?? allopurinol (ZYLOPRIM) 300 MG tablet Take 300 mg by mouth daily.   05/18/2021  ??  ibuprofen (ADVIL) 800 MG tablet Take 800 mg by mouth 3 (three) times daily as needed for moderate pain.   05/18/2021  ?? indomethacin (INDOCIN) 50 MG capsule Take 50 mg by mouth 3 (three) times daily as needed for moderate pain.   05/18/2021  ?? levocetirizine (XYZAL) 5 MG tablet Take 5 mg by mouth every evening.   05/18/2021  ?? levothyroxine (SYNTHROID) 25 MCG tablet Take 25 mcg by mouth daily.   Past Week  ?? lisinopril (ZESTRIL) 20 MG tablet Take 20 mg by mouth daily.   05/18/2021  ?? milk thistle 175 MG tablet Take 175 mg by mouth daily.   05/17/2021  ?? mirtazapine (REMERON) 15  MG tablet Take 15 mg by mouth at bedtime as needed (sleep).   05/05/2021  ?? Multiple Vitamins-Minerals ([employer] SPECTRAVITE ULTRA MENS) TABS Take 1 tablet by mouth daily.   05/18/2021  ?? oxcarbazepine (TRILEPTAL) 600 MG tablet Take 600 mg by mouth 2 (two) times daily.   05/18/2021  ?? oxyCODONE-acetaminophen (PERCOCET) 7.5-325 MG tablet Take 1 tablet by mouth every 8 (eight) hours as needed for severe pain.   05/05/2021  ?? propranolol (INDERAL) 10 MG tablet Take 10 mg by mouth 2 (two) times daily.   05/18/2021 at 10 am  ?  ? ?Vitals  ? ?Vitals:  ? 05/25/21 0446 05/25/21 0500 05/25/21 0749 05/25/21 1441  ?BP: (!) 132/96  137/90 (!) 123/96  ?Pulse: 86  90 100  ?Resp: 17  18 17   ?Temp: 98.5 ?F (36.9 ?C)  98.1 ?F (36.7 ?C) 98 ?F (36.7 ?C)  ?TempSrc:   Oral   ?SpO2: 97%  97%   ?Weight:  79.4 kg    ?  ? ?There is no height or weight on file to calculate BMI. ? ?Physical Exam  ?General: Lying comfortably in bed; in no acute distress.  ?HENT: Normal oropharynx and mucosa. Normal external appearance of ears and nose.  ?Neck: Supple, no pain or tenderness  ?CV: No peripheral edema.  ?Pulmonary: Symmetric Chest rise. Normal respiratory effort.  ?Ext: No cyanosis, edema, or deformity  ?Skin: No rash. Normal palpation of skin.   ?  ?  ?Neurologic Examination  ?Mental status/Cognition: Alert, oriented to self, place, month and year, good attention.  ?Speech/language: Fluent, comprehension intact. ?Cranial nerves:  ? CN II Pupils equal and reactive to light, no VF deficits   ? CN III,IV,VI EOM intact, no gaze preference or deviation, no nystagmus   ? CN V normal sensation in V1, V2, and V3 segments bilaterally   ? CN VII no asymmetry, no nasolabial fold flattening   ? CN VIII normal hearing to speech   ? CN IX & X normal palatal elevation, no uvular deviation   ? CN XI 5/5 head turn and 5/5 shoulder shrug bilaterally   ? CN XII midline tongue protrusion   ?  ?Motor:  ?Mvmt Root Nerve  Muscle Right Left Comments  ?SA C5/6 Ax Deltoid 5/5  4+/5    ?EF C5/6 Mc Biceps 5/5 4+/5    ?EE C6/7/8 Rad Triceps 5/5 4+/5    ?WF C6/7 Med FCR        ?WE C7/8 PIN ECU        ?F Ab C8/T1 U ADM/FDI        ?HF L1/2/3 Fem Illopsoas 5/5 4+/5    ?KE L2/3/4 Fem Quad  5/5 5/5    ?DF L4/5 D Peron Tib Ant  5/5 5/5     ?PF S1/2 Tibial Grc/Sol        ?  ?  Reflexes: ?  Right Left Comments  ?Pectoralis        ? Biceps (C5/6) 2+ 2+    ?Brachioradialis (C5/6)      ? Triceps (C6/7)      ? Patellar (L3/4) 2+ 2+    ? Achilles (S1) 2+ 2+    ? Hoffman        ? Plantar        ?Jaw jerk     ?  ?Sensation: ? Light touch intact  ? Pin prick    ? Temperature intact  ? Vibration    ?Proprioception    ?  ?Coordination/Complex Motor:  ?- Finger to Nose significant ataxia with the left but not right hand ?- Heel to shin significant ataxia with the left but not right leg ?- Rapid alternating movement intact ?- Gait: deferred ? ? ?Labs  ? ?CBC:  ?Recent Labs  ?Lab 05/19/21 ?0124 05/19/21 ?0451 05/22/21 ?[id number] 05/23/21 ?[id number]  ?WBC 10.1   < > 9.6 9.5  ?NEUTROABS 6.0  --   --   --   ?HGB 7.1*   < > 6.3* 6.8*  ?HCT 24.0*   < > 23.1* 24.1*  ?MCV 72.3*   < > 77.8* 78.5*  ?PLT 173   < > 195 226  ? < > = values in this interval not displayed.  ? ? ?Basic Metabolic Panel:  ?Lab Results  ?Component Value Date  ? NA 138 05/20/2021  ? K 3.5 05/20/2021  ? CO2 21 (L) 05/20/2021  ? GLUCOSE 88 05/20/2021  ? BUN 13 05/20/2021  ? CREATININE 1.12 05/20/2021  ? CALCIUM 8.1 (L) 05/20/2021  ? GFRNONAA >60 05/20/2021  ? ?Lipid Panel: No results found for: LDLCALC ?HgbA1c: No results found for: HGBA1C ?Urine Drug Screen: No results found for: LABOPIA, COCAINSCRNUR, LABBENZ, AMPHETMU, THCU, LABBARB  ?Alcohol Level No results found for: ETH ? ? ?MRI-Brain: ?IMPRESSION: ?No acute intracranial process. ? ?MRI-C-Spine: ?IMPRESSION: ?Suboptimal image quality. ?Mild foraminal narrowing bilaterally at C5-6 due to spurring. ? ? ?Impression  ? ?Adis Nolon BussingWayne Marten is a 46 y.o. male with a neurological history as outlined below as  well as hypothyroidism, hypertension, alcohol use disorder, Jehovah's Witness.  He presented to the hospital initially with bilateral PEs with right heart strain.  He has since been stabilized and has bee

## 2021-05-25 NOTE — TOC Progression Note (Signed)
Transition of Care (TOC) - Progression Note  ? ? ?Patient Details  ?Name: Kenneth Cross ?MRN: [id number] ?Date of Birth: [birth year] ? ?Transition of Care (TOC) CM/SW Contact  ?Lorri Frederick, LCSW ?Phone Number: ?05/25/2021, 1:28 PM ? ?Clinical Narrative:   CSW spoke with pt regarding DC plan.  Pt still awaiting neurology.  Pt now stating he would consider SNF in [place].  He also asked if the [facility] facility could be recontacted because they had said no beds available and pt wonders if that could have changed.  ? ?CSW checked the hub and pt now has declines from [facility] and [facility] in [place].  CSW attempted to call [facility] number in the hub is wrong/disconnected.  New number on line but when CSW attempted to call that number multiple times it rings but then disconnects with no one answering.  CSW again spoke with Maralyn Sago at [place] and she does not  have any medicaid beds and does not anticipate any openings.    ? ? ? ?Expected Discharge Plan: Skilled Nursing Facility ?Barriers to Discharge: SNF Pending bed offer, Other (must enter comment) (pt reports he has [place], also possibly other coverage) ? ?Expected Discharge Plan and Services ?Expected Discharge Plan: Skilled Nursing Facility ?In-house Referral: Clinical Social Work ?  ?Post Acute Care Choice: Skilled Nursing Facility ?Living arrangements for the past 2 months: Single Family Home ?                ?  ?  ?  ?  ?  ?  ?  ?  ?  ?  ? ? ?Social Determinants of Health (SDOH) Interventions ?  ? ?Readmission Risk Interventions ?   ? View : No data to display.  ?  ?  ?  ? ? ?

## 2021-05-26 LAB — BASIC METABOLIC PANEL
Anion gap: 9 (ref 5–15)
BUN: 9 mg/dL (ref 6–20)
CO2: 23 mmol/L (ref 22–32)
Calcium: 8.6 mg/dL — ABNORMAL LOW (ref 8.9–10.3)
Chloride: 104 mmol/L (ref 98–111)
Creatinine, Ser: 0.88 mg/dL (ref 0.61–1.24)
GFR, Estimated: >60 mL/min (ref >60)
Glucose, Bld: 103 mg/dL — ABNORMAL HIGH (ref 70–99)
Potassium: 3.9 mmol/L (ref 3.5–5.1)
Sodium: 136 mmol/L (ref 135–145)

## 2021-05-26 LAB — CBC
HCT: 27.4 % — ABNORMAL LOW (ref 39.0–52.0)
Hemoglobin: 8 g/dL — ABNORMAL LOW (ref 13.0–17.0)
MCH: 23.7 pg — ABNORMAL LOW (ref 26.0–34.0)
MCHC: 29.2 g/dL — ABNORMAL LOW (ref 30.0–36.0)
MCV: 81.3 fL (ref 80.0–100.0)
Platelets: 310 10*3/uL (ref 150–400)
RBC: 3.37 MIL/uL — ABNORMAL LOW (ref 4.22–5.81)
RDW: 25.1 % — ABNORMAL HIGH (ref 11.5–15.5)
WBC: 8.8 10*3/uL (ref 4.0–10.5)
nRBC: 0 % (ref 0.0–0.2)

## 2021-05-26 LAB — T4, FREE: Free T4: 0.74 ng/dL (ref 0.61–1.12)

## 2021-05-26 MED ORDER — PROPRANOLOL HCL 10 MG PO TABS: 10.0000 mg | ORAL_TABLET | Freq: Two times a day (BID) | ORAL | 1 refills | Status: AC

## 2021-05-26 MED ORDER — ATORVASTATIN CALCIUM 10 MG PO TABS: 10.0000 mg | ORAL_TABLET | Freq: Every day | ORAL | 1 refills | Status: AC

## 2021-05-26 MED ORDER — BACITRACIN-NEOMYCIN-POLYMYXIN OINTMENT TUBE
TOPICAL_OINTMENT | Freq: Two times a day (BID) | CUTANEOUS | Status: DC
  Filled 2021-05-26: qty 14

## 2021-05-26 MED ORDER — LEVOTHYROXINE SODIUM 25 MCG PO TABS: 25.0000 ug | ORAL_TABLET | Freq: Every day | ORAL | 1 refills | Status: AC

## 2021-05-26 MED ORDER — BACITRACIN-NEOMYCIN-POLYMYXIN 400-5-5000 EX OINT: TOPICAL_OINTMENT | Freq: Two times a day (BID) | CUTANEOUS | 0 refills | Status: DC

## 2021-05-26 MED ORDER — DICLOFENAC SODIUM 1 % EX GEL
2.0000 g | Freq: Four times a day (QID) | CUTANEOUS | Status: DC
  Administered 2021-05-26: 2 g via TOPICAL
  Filled 2021-05-26: qty 100

## 2021-05-26 MED ORDER — OXYCODONE-ACETAMINOPHEN 7.5-325 MG PO TABS: 1.0000 | ORAL_TABLET | Freq: Three times a day (TID) | ORAL | 0 refills | Status: AC | PRN

## 2021-05-26 MED ORDER — APIXABAN 5 MG PO TABS: 10.0000 mg | ORAL_TABLET | Freq: Two times a day (BID) | ORAL | 0 refills | Status: DC

## 2021-05-26 MED ORDER — APIXABAN 5 MG PO TABS: 5.0000 mg | ORAL_TABLET | Freq: Two times a day (BID) | ORAL | 1 refills | Status: AC

## 2021-05-26 MED ORDER — POLYETHYLENE GLYCOL 3350 17 G PO PACK: 17.0000 g | PACK | Freq: Every day | ORAL | 0 refills | Status: AC | PRN

## 2021-05-26 MED ORDER — BACITRACIN-NEOMYCIN-POLYMYXIN 400-5-5000 EX OINT
TOPICAL_OINTMENT | Freq: Two times a day (BID) | CUTANEOUS | Status: DC
  Filled 2021-05-26: qty 1

## 2021-05-26 MED ORDER — FERROUS SULFATE 325 (65 FE) MG PO TABS: 325.0000 mg | ORAL_TABLET | Freq: Two times a day (BID) | ORAL | 3 refills | Status: AC

## 2021-05-26 MED ORDER — ASCORBIC ACID 500 MG PO TABS: 500.0000 mg | ORAL_TABLET | Freq: Every day | ORAL | 0 refills | Status: AC

## 2021-05-26 MED ORDER — PANTOPRAZOLE SODIUM 40 MG PO TBEC: 40.0000 mg | DELAYED_RELEASE_TABLET | Freq: Every day | ORAL | 1 refills | Status: DC

## 2021-05-26 NOTE — Care Management (Signed)
Kenneth Cross, Kenneth Cross #[id number] (CSN: [id number]) (46 y.o. M) (Adm: 05/19/21) ?[id number] ?PCP ? ?KEITH, AMANDA L ?Demographics ?Comment ?  ?  ?Address  ?[address] [place] [address]  ? Home Phone  ?[phone]  ? Work Barrister's clerk  ?  ? Mobile Phone  ?  ?   ?  ?  ?Social Security Number  ?[id number]  ? Insurance Information  ?  ? Marital Status  ?Single  ? Religion  ?Jehovah's Witness  ?   ?  ?  ? ?Basic Information ? ?Date Of Birth  ?June 07, 1975 Legal Sex  ?Male Race  ?White or Caucasian Ethnic Group  ?Not Hispanic or Latino Preferred Language  ?English  ? ?Documents Filed to Patient ? ?Power of [employer] Living Will Clinical Unknown Study Attachment Consent Form ABN Waiver After Visit Summary Lab Result Scan Code Status MyChart Status Advance Care Planning  ? Not on File  Not on File  Not on File  Not on File  Filed  Not on File  Not on File  Not on File  FULL [Updated on 05/19/21 0110]  Inactive Jump to the Activity   ? ?Admission Information ? ?Current Information ? ?Attending Provider Admitting Provider Admission Type Admission Status  ?Osvaldo Shipper, MD Kenneth Ivan, MD Urgent Confirmed Admission  ?      ?Admission Date/Time Discharge Date Hospital Service Auth/Cert Status  ?05/19/21  12:26 AM  Critical Care Incomplete  ?      ?Hospital Area Unit Room/Bed   ?[facility] MC-5N ORTHOPEDICS 5N31C/5N31C-01    ?      ? ?Hospital Account ? ?Name Acct ID Class Status Primary Coverage  ?Kenneth Cross, Kenneth Cross [place] [id number] Inpatient Open None  ?  ?   ? ?Guarantor Account (for Hospital Account [id number]) ? ?Name Relation to Pt Service Area Active? Acct Type  ?Kenneth Cross, Kenneth Cross Self [facility] Yes Personal/Family  ?Address Phone    ?[address]  ?[place], [place] [address] [phone])    ?  ?   ? ?Coverage Information (for Hospital Account [id number]) ? ?Not on file ?  ?   ?  ?Care Everywhere ID:  [id number] ?  ? ? ? ?

## 2021-05-26 NOTE — Progress Notes (Signed)
Discussed discharge instructions with the patient and all questioned fully answered. Discussed MAR in detail and patient verbalized understanding. Prescriptions given.  Mother at bedside to transport patient home.  Splint applied to L wrist per MD orders prior to dc.  ?

## 2021-05-26 NOTE — Progress Notes (Signed)
Orthopedic Tech Progress Note ?Patient Details:  ?Roberta ?February 15, 1976 ?[id number] ? ?Ortho Devices ?Type of Ortho Device: Velcro wrist splint ?Ortho Device/Splint Location: lue ?Ortho Device/Splint Interventions: Ordered, Application, Adjustment ?  ?Post Interventions ?Patient Tolerated: Well ? ?Edwina Barth ?05/26/2021, 2:32 PM ? ?

## 2021-05-26 NOTE — Discharge Summary (Signed)
?[facility] ? ?Physician Discharge Summary  ? ?Patient ID: ?Kenneth Cross ?MRN: [id number] ?DOB/AGE: [birth year] 46 y.o. ? ?Admit date: 05/19/2021 ?Discharge date:   05/26/2021 ? ? ?PCP: Catha Nottingham, FNP ? ?DISCHARGE DIAGNOSES:  ?Principal Problem: ?  Pulmonary embolism (HCC) ?Active Problems: ?  Acute deep vein thrombosis (DVT) of popliteal vein of both lower extremities (HCC) ?  Acute respiratory failure with hypoxia (HCC) ?  Left-sided weakness ?  Microcytic anemia ?  Blood transfusion declined because patient is Jehovah's Witness ?  Essential hypertension ?  Hyperlipidemia ?  Cerebral ataxia (HCC) ?  Hypothyroidism ?  History of gout ? ? ?RECOMMENDATIONS FOR OUTPATIENT FOLLOW UP: ?Needs repeat TSH and free T4 in 3 to 4 weeks ?Consider referral to neurology in the outpatient setting for a nerve conduction velocity study for further evaluation of his left-sided paresthesia ?Check CBC and basic metabolic panel in 4 to 5 days if at SNF or at follow-up with PCP if going home ? ? ? ? ?CODE STATUS: Full code ? ?DISCHARGE CONDITION: fair ? ?Diet recommendation: Low-sodium ? ?INITIAL HISTORY: ?46 year old male with history of cerebellar degeneration causing ataxia, hypothyroidism, hypertension, seizure disorder, alcohol use, Jehovah's witness who was transferred from [facility] emergency department for the management of bilateral PE with right ventricular strain.  He initially presented there with dyspnea, chest pain.  He was admitted under ICU service.  After admission, IR, vascular surgery consulted, he underwent IVC filter placement, started on heparin drip.  Patient transferred to our service on 3/26.  Started on oral anticoagulation with Eliquis.  PT/OT recommended skilled nursing facility on discharge, difficult placement due to lack of insurance. ?Subsequently on 3/29 he complained of waking up at 3 AM with left-sided weakness numbness and occasional pain in his left arm.  Neurological  work-up was initiated which was unremarkable.  Seen by neurology who does not have any further recommendations.  May need outpatient nerve conduction velocity studies. ? ?Consultants: Pulmonology, vascular surgery, interventional radiology, neurology ?  ?Procedures: IVC filter placement ? ? ?HOSPITAL COURSE:  ? ?* Pulmonary embolism (HCC) ?Presented with dyspnea, chest pain.  Started on heparin infusion on admission.   ?Echo showed EF of 65 to 70%, right ventricular pressure overload, moderately elevated pulmonary artery pressure. ?He underwent IVC filter placement by IR.   ?Heparin was transitioned to Eliquis.   ?Challenging situation because of his chronic anemia and Jehovah's Witness status. ?Respiratory status is stable.  Saturating normal on room air. ? ?Acute deep vein thrombosis (DVT) of popliteal vein of both lower extremities (HCC) ?IVC filter was placed.  Vascular surgery was following.  Vascular surgery recommending to continue oral  anticoagulation. ? ?Acute respiratory failure with hypoxia (HCC) ?Seems to be stable from respiratory standpoint.  Now on room air. ? ?Left-sided weakness ?On 3/29 patient complained of left-sided weakness numbness and occasional shooting pain in his left arm.  MRI brain and MRI cervical spine without any acute findings.  MRI cervical spine was a suboptimal study.  Noted clear reason for his left-sided weakness is noted.  Somewhat atypical presentation.  Seen by neurology.  They did not have any further recommendations for inpatient work-up.  Could be related to peripheral neuropathy.  May need nerve conduction velocity studies as outpatient.  Discussed with patient and he understands and agrees. ? ?Microcytic anemia ?History of recent hematochezia.  Had a colonoscopy with local GI and was found to have diverticuli and polyp in the rectum.  Had a couple of  episodes of rectal bleeding.  Currently does not have any active rectal bleeding. Suspected to be from hemorrhoidal  source versus diverticular source.  No intervention planned by GI.  ?Continue iron supplementation.  ?Hemoglobin low but stable.  Minimize blood draws as much as possible. ? ?Blood transfusion declined because patient is Jehovah's Witness ?Declines blood transfusion. ? ?Essential hypertension ?Blood pressure stable for the most part.  Occasional high readings noted.  Propranolol was resumed.  Blood pressures well controlled.  Continue to hold lisinopril. ? ?Hyperlipidemia ?Continue statin ? ?Cerebral ataxia (HCC) ?History of alcohol use with cerebral degeneration/seizure ? ?On oxcarbazepine ?Uses walker for ambulation.   ?PT/OT consulted, recommended skilled nursing facility but patient is uninsured so placement might be difficult.  TOC is following. ? ?Hypothyroidism ?Continue levothyroxine.  Patient mentions that he is noncompliant with his levothyroxine at home.  It could explain the elevated TSH level of 12.69.  Free T4 is noted to be normal.  We will recommend that TSH be rechecked in a few weeks.  Will not make any changes to his dose at this time.  ? ?History of gout ?Continue allopurinol ? ? ? ?Patient is stable.  Disposition is unclear. ? ? ?PERTINENT LABS: ? ?The results of significant diagnostics from this hospitalization (including imaging, microbiology, ancillary and laboratory) are listed below for reference.   ? ?Microbiology: ?Recent Results (from the past 240 hour(s))  ?MRSA Next Gen by PCR, Nasal     Status: None  ? Collection Time: 05/19/21 12:44 AM  ? Specimen: Nasal Mucosa; Nasal Swab  ?Result Value Ref Range Status  ? MRSA by PCR Next Gen NOT DETECTED NOT DETECTED Final  ?  Comment: (NOTE) ?The GeneXpert MRSA Assay (FDA approved for NASAL specimens only), ?is one component of a comprehensive MRSA colonization surveillance ?program. It is not intended to diagnose MRSA infection nor to guide ?or monitor treatment for MRSA infections. ?Test performance is not FDA approved in patients less than 2  years ?old. ?Performed at [facility], [address], [place], [place] ?[address] ?  ?  ? ?Labs: ? ? ? ? ?Basic Metabolic Panel: ?Recent Labs  ?Lab 05/20/21 ?0418 05/26/21 ?0352  ?NA 138 136  ?K 3.5 3.9  ?CL 108 104  ?CO2 21* 23  ?GLUCOSE 88 103*  ?BUN 13 9  ?CREATININE 1.12 0.88  ?CALCIUM 8.1* 8.6*  ? ?CBC: ?Recent Labs  ?Lab 05/19/21 ?1637 05/20/21 ?0418 05/22/21 ?0243 05/23/21 ?0998 05/26/21 ?3382  ?WBC  --  8.3 9.6 9.5 8.8  ?HGB 6.2* 6.4* 6.3* 6.8* 8.0*  ?HCT 22.3* 22.7* 23.1* 24.1* 27.4*  ?MCV  --  73.9* 77.8* 78.5* 81.3  ?PLT  --  145* 195 226 310  ? ? ?BNP: ?BNP (last 3 results) ?Recent Labs  ?  05/19/21 ?0124  ?BNP 1,082.0*  ? ? ? ?CBG: ?Recent Labs  ?Lab 05/19/21 ?1915 05/19/21 ?2331 05/20/21 ?5053 05/20/21 ?9767 05/24/21 ?3419  ?GLUCAP 122* 89 98 89 90  ? ? ? ?IMAGING STUDIES ?MR BRAIN WO CONTRAST ? ?Result Date: 05/24/2021 ?CLINICAL DATA:  Neuro deficit, stroke suspected, numbness in left arm and leg EXAM: MRI HEAD WITHOUT CONTRAST TECHNIQUE: Multiplanar, multiecho pulse sequences of the brain and surrounding structures were obtained without intravenous contrast. COMPARISON:  None. FINDINGS: Brain: No restricted diffusion to suggest acute or subacute infarct. No acute hemorrhage, mass, mass effect, or midline shift. No hydrocephalus or extra-axial collection. Vascular: Normal flow voids. Skull and upper cervical spine: Normal marrow signal. Sinuses/Orbits: Minimal mucosal  thickening in the ethmoid air cells. The orbits are unremarkable. Other: Trace fluid in the left mastoid air cells. IMPRESSION: No acute intracranial process. Electronically Signed   By: Wiliam KeAlison  Vasan M.D.   On: 05/24/2021 11:15  ? ?MR CERVICAL SPINE WO CONTRAST ? ?Result Date: 05/24/2021 ?CLINICAL DATA:  Cervical radiculopathy.  Left-sided paresthesia. EXAM: MRI CERVICAL SPINE WITHOUT CONTRAST TECHNIQUE: Multiplanar, multisequence MR imaging of the cervical spine was performed. No intravenous contrast was administered. COMPARISON:   None. FINDINGS: Alignment: Normal Vertebrae: Normal bone marrow. Negative for fracture or mass. Small hemangioma C6 vertebral body. Cord: Cord evaluation limited due to patient motion and suboptimal image qu

## 2021-05-26 NOTE — TOC Progression Note (Addendum)
Transition of Care (TOC) - Progression Note  ? ? ?Patient Details  ?Name: Silvino Julus Kelley ?MRN: [id number] ?Date of Birth: [birth year] ? ?Transition of Care (TOC) CM/SW Contact  ?Lockie Pares, RN ?Phone Number: ?05/26/2021, 11:33 AM ? ?Clinical Narrative:    ?Patient to be DC today. Ordered WC 3:1 and olling walke rto be brought to the bedside. Is accepted for Home health at [facility]. Faxed DC summary, orders, H&P demographics to [facility]  [phone] ?1200 CSW spoke with patient, he already has 3:1 and walker, he does need a new wheelchair, [employer] is making sure they can provide it as he has [employer]) # [phone] . If not will call DME companies in [place] for shipping..  ? [facility] [phone] as we are still checking , They can  provide, but it would be next week.  ?1348 wheelchair order faxed to [facility]-  [phone] ?1400 decided that he wants walker and 3:1- ordered and faxed to [facility] in [place] [phone]  ?Expected Discharge Plan: Home w Home Health Services ?Barriers to Discharge:  ([facility]cking for [facility]) ? ?Expected Discharge Plan and Services ?Expected Discharge Plan: Home w Home Health Services ?In-house Referral: Clinical Social Work ?  ?Post Acute Care Choice: Skilled Nursing Facility ?Living arrangements for the past 2 months: Single Family Home ?                ?  ?  ?  ?  ?  ?  ?  ?  ?  ?  ? ? ?Social Determinants of Health (SDOH) Interventions ?  ? ?Readmission Risk Interventions ?   ? View : No data to display.  ?  ?  ?  ? ? ?

## 2021-05-26 NOTE — Plan of Care (Signed)

## 2021-05-26 NOTE — Plan of Care (Signed)

## 2021-06-02 NOTE — Progress Notes (Signed)
CSW received call from pt mother Hilda Blades.  They never received DME, called [facility], who said they do not have any orders.  Per prior notes, DME orders faxed to [facility] last Friday.  CSW spoke with Museum/gallery conservator at [facility] who confirmed no orders, asked that they be faxed to [phone], which was done.  CSW updated Hilda Blades on this. ?Lurline Idol, MSW, LCSW ?4/7/202310:25 AM  ?

## 2021-06-19 ENCOUNTER — Other Ambulatory Visit: Payer: Self-pay | Admitting: *Deleted

## 2021-06-19 DIAGNOSIS — I82433 Acute embolism and thrombosis of popliteal vein, bilateral: Secondary | ICD-10-CM

## 2021-06-27 NOTE — Progress Notes (Signed)
?HISTORY AND PHYSICAL  ? ? ? ?CC:  follow up. ?Requesting Provider:  Dorothyann Peng, FNP ? ?HPI: This is a 46 y.o. male who is here today for follow up for left popliteal artery thrombus.  Vascular surgery was consulted on 05/19/2021 after he was transferred to [facility] with bilateral PE with right heart strain. He also had GIB with hgb of 6.5, but given he is Jehovah's Witness, he did not receive blood products.  There was an incidental finding of left popliteal artery thrombus during venous duplex of left leg.  He did not have rest pain, tissue loss and motor and sensation were in tact.  He did undergo IVC filter placement by IR (05/19/2021) during that hospitalization.   ? ?Dr. Virl Cagey discussed with pt that given he remains asymptomatic, this can be followed on outpatient basis and reserve intervention for lifestyle limiting claudication, rest pain or tissue loss.  Pt returns today for follow up.   ? ?Pt was continued on Eliquis. ? ?The pt returns today for follow up.  He states that the swelling in his legs is getting better.  He denies any rest pain or sores on his feet.  He doesn't really walk much.  He is trying to get in with PT via his PCP but having trouble getting this set up.  He states he has been seeing neurology in [place] for about 12 years, but would like to get set up here in [place] as it is easier to get to.  He is compliant with his Eliquis.  He states that he has been told he may be on Eliquis for 6 months or maybe even lifetime.  He has not been evaluated by Hematology.   ? ?The pt is on a statin for cholesterol management.    ?The pt is not on an aspirin.    Other AC:  Eliquis ?The pt is not on medication for hypertension.  ?The pt does not have diabetes. ?Tobacco hx:  never ? ? ?PMH ?Patient Active Problem List  ? Diagnosis Date Noted  ? Left-sided weakness 05/24/2021  ? Acute respiratory failure with hypoxia ([place]) 05/24/2021  ? History of gout 05/24/2021  ? Cerebral ataxia ([place]) 05/21/2021  ? Blood  transfusion declined because patient is Jehovah's Witness 05/21/2021  ? Microcytic anemia 05/21/2021  ? Hypothyroidism 05/21/2021  ? Hyperlipidemia 05/21/2021  ? Essential hypertension 05/21/2021  ? Pulmonary embolism ([place]) 05/19/2021  ? Acute deep vein thrombosis (DVT) of popliteal vein of both lower extremities (HCC)   ? LGI bleed   ? ? ? ?Past Surgical History:  ?Procedure Laterality Date  ? IR IVC FILTER PLMT / S&I /IMG GUID/MOD SED  05/19/2021  ? ? ?Allergies  ?Allergen Reactions  ? Whole Blood   ?  Jehovah's - declines blood or blood products, will accept fractions such as albumin ,hemoglobin  ? ? ?Current Outpatient Medications  ?Medication Sig Dispense Refill  ? allopurinol (ZYLOPRIM) 300 MG tablet Take 300 mg by mouth daily.    ? apixaban (ELIQUIS) 5 MG TABS tablet Take 2 tablets (10 mg total) by mouth 2 (two) times daily for 3 days. 10 tablet 0  ? apixaban (ELIQUIS) 5 MG TABS tablet Take 1 tablet (5 mg total) by mouth 2 (two) times daily. START ON 05/29/21, ONLY AFTER THE 10MG  DOSAGE HAS COMPLETED 60 tablet 1  ? ascorbic acid (VITAMIN C) 500 MG tablet Take 1 tablet (500 mg total) by mouth daily. 30 tablet 0  ? atorvastatin (LIPITOR) 10  MG tablet Take 1 tablet (10 mg total) by mouth daily. 30 tablet 1  ? ferrous sulfate 325 (65 FE) MG tablet Take 1 tablet (325 mg total) by mouth 2 (two) times daily with a meal. 60 tablet 3  ? levocetirizine (XYZAL) 5 MG tablet Take 5 mg by mouth every evening.    ? levothyroxine (SYNTHROID) 25 MCG tablet Take 1 tablet (25 mcg total) by mouth daily. 30 tablet 1  ? mirtazapine (REMERON) 15 MG tablet Take 15 mg by mouth at bedtime as needed (sleep).    ? Multiple Vitamins-Minerals ([employer] SPECTRAVITE ULTRA MENS) TABS Take 1 tablet by mouth daily.    ? neomycin-bacitracin-polymyxin (NEOSPORIN) ointment Apply topically 2 (two) times daily. 15 g 0  ? oxcarbazepine (TRILEPTAL) 600 MG tablet Take 600 mg by mouth 2 (two) times daily.    ? oxyCODONE-acetaminophen (PERCOCET) 7.5-325 MG  tablet Take 1 tablet by mouth every 8 (eight) hours as needed for severe pain. 30 tablet 0  ? pantoprazole (PROTONIX) 40 MG tablet Take 1 tablet (40 mg total) by mouth daily at 12 noon. 30 tablet 1  ? polyethylene glycol (MIRALAX / GLYCOLAX) 17 g packet Take 17 g by mouth daily as needed for moderate constipation. 14 each 0  ? propranolol (INDERAL) 10 MG tablet Take 1 tablet (10 mg total) by mouth 2 (two) times daily. 60 tablet 1  ? ?No current facility-administered medications for this visit.  ? ? ?Family Hx:  denies family hx of blood clots. ? ?Social History  ? ?Socioeconomic History  ? Marital status: Single  ?  Spouse name: Not on file  ? Number of children: Not on file  ? Years of education: Not on file  ? Highest education level: Not on file  ?Occupational History  ? Not on file  ?Tobacco Use  ? Smoking status: Not on file  ? Smokeless tobacco: Not on file  ?Substance and Sexual Activity  ? Alcohol use: Not on file  ? Drug use: Not on file  ? Sexual activity: Not on file  ?Other Topics Concern  ? Not on file  ?Social History Narrative  ? Not on file  ? ?Social Determinants of Health  ? ?Financial Resource Strain: Not on file  ?Food Insecurity: Not on file  ?Transportation Needs: Not on file  ?Physical Activity: Not on file  ?Stress: Not on file  ?Social Connections: Not on file  ?Intimate Partner Violence: Not on file  ? ? ? ?REVIEW OF SYSTEMS:  ? ?[X]  denotes positive finding, [ ]  denotes negative finding ?Cardiac  Comments:  ?Chest pain or chest pressure:    ?Shortness of breath upon exertion:    ?Short of breath when lying flat:    ?Irregular heart rhythm:    ?    ?Vascular    ?Pain in calf, thigh, or hip brought on by ambulation:    ?Pain in feet at night that wakes you up from your sleep:     ?Blood clot in your veins:    ?Leg swelling:     ?    ?Pulmonary    ?Oxygen at home:    ?Productive cough:     ?Wheezing:     ?    ?Neurologic    ?Sudden weakness in arms or legs:     ?Sudden numbness in arms or  legs:     ?Sudden onset of difficulty speaking or slurred speech:    ?Temporary loss of vision in one eye:     ?  Problems with dizziness:     ?    ?Gastrointestinal    ?Blood in stool:     ?Vomited blood:     ?    ?Genitourinary    ?Burning when urinating:     ?Blood in urine:    ?    ?Psychiatric    ?Major depression:     ?    ?Hematologic    ?Bleeding problems:    ?Problems with blood clotting too easily:    ?    ?Skin    ?Rashes or ulcers:    ?    ?Constitutional    ?Fever or chills:    ? ? ?PHYSICAL EXAMINATION: ? ?Today's Vitals  ? 06/30/21 1435  ?BP: 113/75  ?Pulse: 86  ?Resp: 20  ?Temp: 98.2 ?F (36.8 ?C)  ?TempSrc: Temporal  ?SpO2: 96%  ?Height: 5\' 10"  (1.778 m)  ? ?Body mass index is 25.56 kg/m?. ? ? ?General:  WDWN in NAD; vital signs documented above ?Gait: Not observed ?HENT: WNL, normocephalic ?Pulmonary: normal non-labored breathing , without wheezing ?Cardiac: regular HR, without carotid bruits ?Abdomen: soft, NT, no masses ?Skin: without rashes ?Vascular Exam/Pulses: ?Brisk triphasic right ulnar doppler signal, left radial pulse is palpable ?Brisk right AT/PT doppler signals; monophasic left AT and brisk triphasic left PT doppler signal ?Extremities: without ischemic changes, without Gangrene , without cellulitis; without open wounds ?Musculoskeletal: no muscle wasting or atrophy  ?Neurologic: A&O X 3 ?Psychiatric:  The pt has Normal affect. ? ? ?Non-Invasive Vascular Imaging:   ?ABI's/TBI's on 06/30/2021: ?Right:  1.17/1.08 - Great toe pressure: 126 ?Left:  0.7/0.54 - Great toe pressure: 63 ? ? ?Previous arterial duplex on 05/19/2021: ?Left: Total occlusion noted in the popliteal artery, TP trunk, and  ?peroneal artery. Acute-appearing thrombus noted in the mid to distal  ?popliteal artery with collateralized, monophasic flow to the posterior  ?tibial and anterior tibial arteries. ? ? ?ASSESSMENT/PLAN:: 46 y.o. male here for follow up for PAD with hx of total occlusion in the left popliteal, TP trunk,  and peroneal artery and hx of BLE DVT with PE and insertion of IVC filter per IR March 2023. ? ?-ABI on the right normal and about 70% on the left.  He does have a triphasic left PT doppler signal.  Bilateral f

## 2021-06-30 ENCOUNTER — Ambulatory Visit (HOSPITAL_COMMUNITY)
Admission: RE | Admit: 2021-06-30 | Discharge: 2021-06-30 | Disposition: A | Discharge status: Home / Self Care | Payer: Medicaid - Out of State | Source: Ambulatory Visit | Attending: Vascular Surgery | Admitting: Vascular Surgery

## 2021-06-30 ENCOUNTER — Encounter: Payer: Self-pay | Admitting: Physician Assistant

## 2021-06-30 ENCOUNTER — Ambulatory Visit (INDEPENDENT_AMBULATORY_CARE_PROVIDER_SITE_OTHER): Payer: PRIVATE HEALTH INSURANCE | Admitting: Physician Assistant

## 2021-06-30 VITALS — BP 113/75 | HR 86 | Temp 98.2°F | Resp 20 | Ht 70.0 in

## 2021-06-30 DIAGNOSIS — I70202 Unspecified atherosclerosis of native arteries of extremities, left leg: Secondary | ICD-10-CM | POA: Diagnosis not present

## 2021-06-30 DIAGNOSIS — I82433 Acute embolism and thrombosis of popliteal vein, bilateral: Secondary | ICD-10-CM | POA: Diagnosis not present

## 2021-07-04 ENCOUNTER — Other Ambulatory Visit: Payer: Self-pay | Admitting: *Deleted

## 2021-07-04 DIAGNOSIS — I70202 Unspecified atherosclerosis of native arteries of extremities, left leg: Secondary | ICD-10-CM

## 2021-07-06 ENCOUNTER — Encounter: Payer: Self-pay | Admitting: Neurology

## 2021-08-16 ENCOUNTER — Ambulatory Visit (INDEPENDENT_AMBULATORY_CARE_PROVIDER_SITE_OTHER): Payer: Medicaid - Out of State | Admitting: Neurology

## 2021-08-16 ENCOUNTER — Encounter: Payer: Self-pay | Admitting: Neurology

## 2021-08-16 VITALS — BP 136/93 | HR 85 | Ht 70.0 in | Wt 172.0 lb

## 2021-08-16 DIAGNOSIS — M79602 Pain in left arm: Secondary | ICD-10-CM

## 2021-08-16 DIAGNOSIS — G40909 Epilepsy, unspecified, not intractable, without status epilepticus: Secondary | ICD-10-CM | POA: Diagnosis not present

## 2021-08-16 DIAGNOSIS — G119 Hereditary ataxia, unspecified: Secondary | ICD-10-CM

## 2021-08-16 MED ORDER — GABAPENTIN 300 MG PO CAPS: ORAL_CAPSULE | ORAL | 6 refills | Status: DC

## 2021-08-16 NOTE — Patient Instructions (Addendum)
Start Gabapentin 300mg : take 1 capsule every night. Update our office in 1 month, we may increase dose if needed.  2. Schedule EMG/NCV of the left upper extremity  3. Continue all your other medications  4. Follow-up in 4 months, call for any changes

## 2021-08-16 NOTE — Progress Notes (Signed)
NEUROLOGY CONSULTATION NOTE  Tadarrius Christophor Eick MRN: [id number] DOB: 1975-08-02  Referring provider: Doreatha Massed, PA-C Primary care provider: Prince Solian, FNP  Reason for consult:  establish care   Thank you for your kind referral of Kenneth Cross for consultation of the above symptoms. Although his history is well known to you, please allow me to reiterate it for the purpose of our medical record. He is alone in the office today. Records and images were personally reviewed where available.   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 He has been followed for many years at the Neurology [facility] clinic in [place], [place], records were reviewed. His last visit with neurologist Dr. Orion Modest was in 01/2021. 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 He initially had mild symptoms with balance that progressed to both appendicular and axial ataxia. He also has episodes of dystonic events with spasms associated with movement and abnormal posture. He has had EEGs which did not show any EEG correlate during the episodes, however the possibility of this being myoclonic were considered and he has been on oxcarbazepine 600mg  BID.   He presents today mostly for left arm pain that started while he was admitted at [facility] last March 2023. Records indicate he started having left leg cramping and dyspnea on exertion on 3/19 that progressively worsened. At that time, he also complained of left arm pain and stiffness. He was found to have bilateral pulmonary emboli with right hear strain. He had an IVC filter placed and started on Eliquis. During his admission, on 3/29, he complained of waking up at 3am with left-sided weakness, numbness, and occasional pain in the left  arm. MRI brain and cervical spine did not show any acute changes, mild foraminal narrowing bilaterally at C5-6 due to spurring. He was evaluated by Neurology who suspected pain is musculoskeletal as it is reproducible by deep palpation of his left upper arm and lateral forearm. Numbness felt due to neuropathy. Today he states that certain movements when he extends his arm triggers shooting pain into his left hand and fingers. While he was in the hospital, he was mostly having dull numbness on the left arm and leg, his elbow had been hurting and the shooting pain was in the thumb. He has been doing Occupational Therapy and his shoulder was taped because he was told his shoulder is out of socket, however he states that after his last OT session yesterday, shooting pain got worse, now going straight to his fingers and thumb. Symptoms are a whole lot worse in the morning and get better throughout the day. He falls frequently but does not recall a fall occurring just prior to this pain. He states he usually falls on his right side because that is his stronger side. Sometimes both legs go out.   He reports oxcarbazepine helps with tremors and seizures. He would drink coke and "shake like crazy." Caffeine and tea does not tend to do the same. Most of the the time he can tell when he is about to have a seizure, he starts getting a drunk feeling "like in the ocean" and tries to get to a safe place. He would take Baclofen and would be fine. He was previously having seizures every 2 weeks to 4 months, but he has not had  any in a year now, he cannot recall the last time. He lives alone. His parents live close by and help him. He has a walker and uses a wheelchair when traveling. At home he holds on to furniture. He reports he can get up and walk some days, other days he cannot even use his walker, especially if sleep-deprived or tired. He usually gets a couple of hours sleep at a time and takes prn Remeron. He naps during the  day. He recalls having a sleep study 4-5 years ago but did not sleep during the test. He has occasional difficulty swallowing, his sinuses drain and he starts hacking. He denies any neck pain. In the past it was felt that his ataxia may be alcohol-related. He states that he has not had much alcohol since he was discharged from the hospital 2 months ago, he states he can count the beers he has had in the last 4 months. His mother and brother had seizures in childhood. He has passed out with some of the falls but denies any neurosurgical procedures. No history of febrile convulsions, CNS infections such as meningitis/encephalitis.  He has had extensive evaluations over the years: Per records, negative testing for anti-Hu, anti-Jo, anti-Ro, anti-GAD antibodies, very long chain fatty acids, FTA-ABS/RPR, vitamin B1, B12, thiamine, folate, CK, vitamin D, ceruloplasmin, SPEP, amino acid analysis, celiac panel, ANA, copper, zinc, organic acids, thyroglobulin, TPO, Lyme, HTLV 1/2, HIV, paraneoplastic panel. Genetic panel considered but cost-prohibitive.   MRI with and without contrast in 12/2018 reported moderate diffuse prominence of the ventricular system and extra-axial spaces of the supratentorial compartment as well as the cerebellum, compatible with moderately extensive diffuse parenchymal volume loss without a specific distribution, significantly greater than expected for stated age. Although the appearances nonspecific, given the patient's history of alcohol abuse, it is noted that these findings may be seen in the setting of significant alcohol abuse. No definite hydrocephalus.   EMU 06/22/14-06/25/14 normal. Typical events were not captured.   EEG in 11/2018 reported mild to moderate amount of low to medium voltage, polymorphic 2-6 Hz slow wave activity seen diffusely over both hemispheres with frontal emphasis.  EMG/NCV 08/2017 normal both UE and LE.   PAST MEDICAL HISTORY: Past Medical History:   Diagnosis Date   Seizures (HCC)     PAST SURGICAL HISTORY: Past Surgical History:  Procedure Laterality Date   IR IVC FILTER PLMT / S&I /IMG GUID/MOD SED  05/19/2021    MEDICATIONS: Current Outpatient Medications on File Prior to Visit  Medication Sig Dispense Refill   allopurinol (ZYLOPRIM) 300 MG tablet Take 300 mg by mouth daily.     apixaban (ELIQUIS) 5 MG TABS tablet Take 1 tablet (5 mg total) by mouth 2 (two) times daily. START ON 05/29/21, ONLY AFTER THE 10MG  DOSAGE HAS COMPLETED 60 tablet 1   ascorbic acid (VITAMIN C) 500 MG tablet Take 1 tablet (500 mg total) by mouth daily. 30 tablet 0   atorvastatin (LIPITOR) 10 MG tablet Take 1 tablet (10 mg total) by mouth daily. 30 tablet 1   colchicine 0.6 MG tablet Take 0.6 mg by mouth daily as needed.     ferrous sulfate 325 (65 FE) MG tablet Take 1 tablet (325 mg total) by mouth 2 (two) times daily with a meal. 60 tablet 3   hydrochlorothiazide (HYDRODIURIL) 12.5 MG tablet Take 12.5 mg by mouth daily.     levocetirizine (XYZAL) 5 MG tablet Take 5 mg by mouth every evening.  levothyroxine (SYNTHROID) 25 MCG tablet Take 1 tablet (25 mcg total) by mouth daily. 30 tablet 1   mirtazapine (REMERON) 15 MG tablet Take 15 mg by mouth at bedtime as needed (sleep).     Multiple Vitamins-Minerals ([employer] SPECTRAVITE ULTRA MENS) TABS Take 1 tablet by mouth daily.     neomycin-bacitracin-polymyxin (NEOSPORIN) ointment Apply topically 2 (two) times daily. 15 g 0   oxcarbazepine (TRILEPTAL) 600 MG tablet Take 600 mg by mouth 2 (two) times daily.     apixaban (ELIQUIS) 5 MG TABS tablet Take 2 tablets (10 mg total) by mouth 2 (two) times daily for 3 days. (Patient taking differently: Take 10 mg by mouth 2 (two) times daily. Still taking) 10 tablet 0   oxyCODONE-acetaminophen (PERCOCET) 7.5-325 MG tablet Take 1 tablet by mouth every 8 (eight) hours as needed for severe pain. 30 tablet 0   pantoprazole (PROTONIX) 40 MG tablet Take 1 tablet (40 mg total) by  mouth daily at 12 noon. 30 tablet 1   polyethylene glycol (MIRALAX / GLYCOLAX) 17 g packet Take 17 g by mouth daily as needed for moderate constipation. 14 each 0   propranolol (INDERAL) 10 MG tablet Take 1 tablet (10 mg total) by mouth 2 (two) times daily. 60 tablet 1   No current facility-administered medications on file prior to visit.    ALLERGIES: Allergies  Allergen Reactions   Whole Blood     [facility] - declines blood or blood products, will accept fractions such as albumin ,hemoglobin    FAMILY HISTORY: Family History  Problem Relation Age of Onset   High blood pressure Father    Broken bones Father     SOCIAL HISTORY: Social History   Socioeconomic History   Marital status: Single    Spouse name: Not on file   Number of children: Not on file   Years of education: Not on file   Highest education level: Not on file  Occupational History   Not on file  Tobacco Use   Smoking status: Never    Passive exposure: Never   Smokeless tobacco: Never  Substance and Sexual Activity   Alcohol use: Never   Drug use: Never   Sexual activity: Never  Other Topics Concern   Not on file  Social History Narrative   Right handed   Live alone one story    Caffeine rarely   Social Determinants of Health   Financial Resource Strain: Not on file  Food Insecurity: Not on file  Transportation Needs: Not on file  Physical Activity: Not on file  Stress: Not on file  Social Connections: Not on file  Intimate Partner Violence: Not on file     PHYSICAL EXAM: Vitals:   08/16/21 1411  BP: (!) 136/93  Pulse: 85  SpO2: 96%   General: No acute distress Head:  Normocephalic/atraumatic Skin/Extremities: No rash, no edema Neurological Exam: Mental status: alert and awake, no dysarthria or aphasia, Fund of knowledge is appropriate. Attention and concentration are normal.    Cranial nerves: CN I: not tested CN II: pupils equal, round and reactive to light, visual fields  intact CN III, IV, VI:  full range of motion, no nystagmus, no ptosis CN V: facial sensation intact CN VII: upper and lower face symmetric CN VIII: hearing intact to conversation Bulk & Tone: normal, no fasciculations. Motor: 5/5 throughout with no pronator drift. Sensation: intact to light touch, pin, vibration sense. Reports decreased temperature sensation on the left thumb and fingers. Deep Tendon  Reflexes: brisk +2 throughout, no ankle clonus, +right Hoffman sign Cerebellar: significant ataxia on finger to nose testing and heel to shin testing L>R.  Gait: narrow-based and steady, able to tandem walk adequately. 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 He has been seeing Neurology at [facility] clinic for many years for the cerebellar ataxia, seizures, tremors with unclear etiology. Extensive testing as noted above. He is on oxcarbazepine 600mg  BID and denies any seizures in a year, it also helps with his tremors. His main concern today is the left arm pain that appears musculoskeletal with reproducible pain with certain positions. He was referred to outpatient Neurology for EMG/NCV testing due to complaint of numbness, this will be scheduled for him. We discussed starting Gabapentin 300mg  qhs for pain, side effects discussed. May uptitrate as tolerated. Continue with physical/occupational therapy. He does not drive. Follow-up in 4 months, call for any changes.    Thank you for allowing me to participate in the care of this patient. Please do not hesitate to call for any questions or concerns.   , M.D.  CC: , PA-C, Patrcia Dolly, FNP

## 2021-11-20 ENCOUNTER — Encounter: Payer: Medicaid - Out of State | Admitting: Neurology

## 2021-11-27 ENCOUNTER — Encounter: Payer: Medicaid - Out of State | Admitting: Neurology

## 2021-11-27 ENCOUNTER — Encounter: Payer: Self-pay | Admitting: Neurology

## 2021-11-27 DIAGNOSIS — Z029 Encounter for administrative examinations, unspecified: Secondary | ICD-10-CM

## 2021-12-19 ENCOUNTER — Encounter: Payer: Self-pay | Admitting: Neurology

## 2021-12-19 ENCOUNTER — Ambulatory Visit (INDEPENDENT_AMBULATORY_CARE_PROVIDER_SITE_OTHER): Payer: Medicaid - Out of State | Admitting: Neurology

## 2021-12-19 VITALS — BP 120/82 | HR 97 | Ht 70.0 in | Wt 180.0 lb

## 2021-12-19 DIAGNOSIS — G40909 Epilepsy, unspecified, not intractable, without status epilepticus: Secondary | ICD-10-CM

## 2021-12-19 DIAGNOSIS — G119 Hereditary ataxia, unspecified: Secondary | ICD-10-CM | POA: Diagnosis not present

## 2021-12-19 DIAGNOSIS — M79602 Pain in left arm: Secondary | ICD-10-CM

## 2021-12-19 NOTE — Patient Instructions (Signed)
Good to see you.  Finish off the Gabapentin and see if the pain worsens off medication. If pain recurs, you can restart medication.   2. Continue all your other medications  3. Restart doing home physical therapy exercises regularly  4. Follow-up in 6 months, call for any changes

## 2021-12-19 NOTE — Progress Notes (Signed)
NEUROLOGY FOLLOW UP OFFICE NOTE  Kenneth Cross [id number] [birth year]  HISTORY OF PRESENT ILLNESS: I had the pleasure of seeing Kenneth Cross in follow-up in the neurology clinic on 12/19/2021. He is alone in the office today. The patient was last seen 4 months ago. Records and images were personally reviewed where available.  He had to reschedule the EMG to December. On his last visit, his main concern was the left arm pain, worse with certain positions. He also reports numbness in the 2nd and 3rd digits of his left hand. He was started on Gabapentin 300mg  qhs. He had completed physical therapy. He reports the pain is less, but he is not sure which helped better, PT or Gabapentin, he stopped PT 3-4 weeks ago. Pain is not nearly bothering him as much, but there is no change in the numbness in the 2nd and 3rd digits. His left shoulder pain went away but slowly started to come back, he has not been doing PT exercises regularly at home. His legs still get numb and tingly, R>L. He does note that everything is a lot better since his initial visit. He cannot say he sleeps better, he may fall asleep for a couple of hours but thinks he is sleeping longer at night but still takes daytime naps. He asks about Baclofen, he had ankle surgery and his surgeon stopped the Baclofen, he apparently took another medication with the Baclofen and had a reaction where he could not talk or feed himself and was told he could restart it when better but he has not taken it yet. He has cerebellar ataxia, seizures, tremors with unclear etiology. Extensive testing has been unrevealing at [facility]. He is on oxcarbazepine 600mg  BID and continues to deny any seizures, it also helps with his tremors.    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 He has been followed for many years at the Neurology [facility] clinic in [place], [facility], records were reviewed. His last visit with neurologist Dr. Veronicaview was in 01/2021. 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 He initially had mild symptoms with balance that progressed to both appendicular and axial ataxia. He also has episodes of dystonic events with spasms associated with movement and abnormal posture. He has had EEGs which did not show any EEG correlate during the episodes, however the possibility of this being myoclonic were considered and he has been on oxcarbazepine 600mg  BID.   He presents today mostly for left arm pain that started while he was admitted at [facility] last March 2023. Records indicate he started having left leg cramping and dyspnea on exertion on 3/19 that progressively worsened. At that time, he also complained of left arm pain and stiffness. He was found to have bilateral pulmonary emboli with right hear strain. He had an IVC filter placed and started on Eliquis. During his admission, on 3/29, he complained of waking up at 3am with left-sided weakness, numbness, and occasional pain in the left arm. MRI brain and cervical spine did not show any acute changes, mild foraminal narrowing bilaterally at C5-6 due to spurring. He was evaluated by Neurology who suspected pain is musculoskeletal as it is reproducible by deep palpation of his left upper arm and lateral forearm. Numbness felt due to neuropathy. Today he states that certain  movements when he extends his arm triggers shooting pain into his left hand and fingers. While he was in the hospital, he was mostly having dull numbness on the left arm and leg, his elbow had been hurting and the shooting pain was in the thumb. He has been doing Occupational Therapy and his shoulder was taped because he was told his shoulder is out of socket, however he states that after  his last OT session yesterday, shooting pain got worse, now going straight to his fingers and thumb. Symptoms are a whole lot worse in the morning and get better throughout the day. He falls frequently but does not recall a fall occurring just prior to this pain. He states he usually falls on his right side because that is his stronger side. Sometimes both legs go out.   He reports oxcarbazepine helps with tremors and seizures. He would drink coke and "shake like crazy." Caffeine and tea does not tend to do the same. Most of the the time he can tell when he is about to have a seizure, he starts getting a drunk feeling "like in the ocean" and tries to get to a safe place. He would take Baclofen and would be fine. He was previously having seizures every 2 weeks to 4 months, but he has not had any in a year now, he cannot recall the last time. He lives alone. His parents live close by and help him. He has a walker and uses a wheelchair when traveling. At home he holds on to furniture. He reports he can get up and walk some days, other days he cannot even use his walker, especially if sleep-deprived or tired. He usually gets a couple of hours sleep at a time and takes prn Remeron. He naps during the day. He recalls having a sleep study 4-5 years ago but did not sleep during the test. He has occasional difficulty swallowing, his sinuses drain and he starts hacking. He denies any neck pain. In the past it was felt that his ataxia may be alcohol-related. He states that he has not had much alcohol since he was discharged from the hospital 2 months ago, he states he can count the beers he has had in the last 4 months. His mother and brother had seizures in childhood. He has passed out with some of the falls but denies any neurosurgical procedures. No history of febrile convulsions, CNS infections such as meningitis/encephalitis.  He has had extensive evaluations over the years: Per records, negative testing for anti-Hu,  anti-Jo, anti-Ro, anti-GAD antibodies, very long chain fatty acids, FTA-ABS/RPR, vitamin B1, B12, thiamine, folate, CK, vitamin D, ceruloplasmin, SPEP, amino acid analysis, celiac panel, ANA, copper, zinc, organic acids, thyroglobulin, TPO, Lyme, HTLV 1/2, HIV, paraneoplastic panel. Genetic panel considered but cost-prohibitive.   MRI with and without contrast in 12/2018 reported moderate diffuse prominence of the ventricular system and extra-axial spaces of the supratentorial compartment as well as the cerebellum, compatible with moderately extensive diffuse parenchymal volume loss without a specific distribution, significantly greater than expected for stated age. Although the appearances nonspecific, given the patient's history of alcohol abuse, it is noted that these findings may be seen in the setting of significant alcohol abuse. No definite hydrocephalus.   EMU 06/22/14-06/25/14 normal. Typical events were not captured.   EEG in 11/2018 reported mild to moderate amount of low to medium voltage, polymorphic 2-6 Hz slow wave activity seen diffusely over both hemispheres with frontal emphasis.  EMG/NCV 08/2017 normal both  UE and LE.   PAST MEDICAL HISTORY: Past Medical History:  Diagnosis Date   Seizures ([place])     MEDICATIONS: Current Outpatient Medications on File Prior to Visit  Medication Sig Dispense Refill   allopurinol (ZYLOPRIM) 300 MG tablet Take 300 mg by mouth daily.     apixaban (ELIQUIS) 5 MG TABS tablet Take 2 tablets (10 mg total) by mouth 2 (two) times daily for 3 days. (Patient taking differently: Take 10 mg by mouth 2 (two) times daily. Still taking) 10 tablet 0   apixaban (ELIQUIS) 5 MG TABS tablet Take 1 tablet (5 mg total) by mouth 2 (two) times daily. START ON 05/29/21, ONLY AFTER THE 10MG  DOSAGE HAS COMPLETED 60 tablet 1   ascorbic acid (VITAMIN C) 500 MG tablet Take 1 tablet (500 mg total) by mouth daily. 30 tablet 0   atorvastatin (LIPITOR) 10 MG tablet Take 1 tablet  (10 mg total) by mouth daily. 30 tablet 1   colchicine 0.6 MG tablet Take 0.6 mg by mouth daily as needed.     ferrous sulfate 325 (65 FE) MG tablet Take 1 tablet (325 mg total) by mouth 2 (two) times daily with a meal. 60 tablet 3   gabapentin (NEURONTIN) 300 MG capsule Take 1 capsule every night 30 capsule 6   hydrochlorothiazide (HYDRODIURIL) 12.5 MG tablet Take 12.5 mg by mouth daily.     levocetirizine (XYZAL) 5 MG tablet Take 5 mg by mouth every evening.     levothyroxine (SYNTHROID) 25 MCG tablet Take 1 tablet (25 mcg total) by mouth daily. 30 tablet 1   mirtazapine (REMERON) 15 MG tablet Take 15 mg by mouth at bedtime as needed (sleep).     Multiple Vitamins-Minerals ([employer] SPECTRAVITE ULTRA MENS) TABS Take 1 tablet by mouth daily.     neomycin-bacitracin-polymyxin (NEOSPORIN) ointment Apply topically 2 (two) times daily. 15 g 0   oxcarbazepine (TRILEPTAL) 600 MG tablet Take 600 mg by mouth 2 (two) times daily.     oxyCODONE-acetaminophen (PERCOCET) 7.5-325 MG tablet Take 1 tablet by mouth every 8 (eight) hours as needed for severe pain. 30 tablet 0   pantoprazole (PROTONIX) 40 MG tablet Take 1 tablet (40 mg total) by mouth daily at 12 noon. 30 tablet 1   polyethylene glycol (MIRALAX / GLYCOLAX) 17 g packet Take 17 g by mouth daily as needed for moderate constipation. 14 each 0   propranolol (INDERAL) 10 MG tablet Take 1 tablet (10 mg total) by mouth 2 (two) times daily. 60 tablet 1   No current facility-administered medications on file prior to visit.    ALLERGIES: Allergies  Allergen Reactions   Whole Blood     [facility] - declines blood or blood products, will accept fractions such as albumin ,hemoglobin    FAMILY HISTORY: Family History  Problem Relation Age of Onset   High blood pressure Father    Broken bones Father     SOCIAL HISTORY: Social History   Socioeconomic History   Marital status: Single    Spouse name: Not on file   Number of children: Not on file    Years of education: Not on file   Highest education level: Not on file  Occupational History   Not on file  Tobacco Use   Smoking status: Never    Passive exposure: Never   Smokeless tobacco: Never  Substance and Sexual Activity   Alcohol use: Never   Drug use: Never   Sexual activity: Never  Other Topics Concern  Not on file  Social History Narrative   Right handed   Live alone one story    Caffeine rarely   Social Determinants of Health   Financial Resource Strain: Not on file  Food Insecurity: Not on file  Transportation Needs: Not on file  Physical Activity: Not on file  Stress: Not on file  Social Connections: Not on file  Intimate Partner Violence: Not on file     PHYSICAL EXAM: Vitals:   12/19/21 1544  BP: 120/82  Pulse: 97  SpO2: 94%   General: No acute distress, sitting on wheelchair Head:  Normocephalic/atraumatic Skin/Extremities: No rash, no edema Neurological Exam: alert and awake. No aphasia or dysarthria. Fund of knowledge is appropriate.  Attention and concentration are normal.   Cranial nerves: Pupils equal, round. Extraocular movements intact with no nystagmus. Visual fields full.  No facial asymmetry.  Motor: Bulk and tone normal, muscle strength 5/5 throughout with no pronator drift.  Again with ataxia on bilateral finger to nose and heel to shin testing L>R. Gait not tested. No tremor today.  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 He had been seeing Neurology at [facility] clinic for many years for the cerebellar ataxia, seizures, tremors with unclear etiology. Extensive testing has been unrevealing. He continues to deny any seizures in over a year, continue oxcarbazepine 600mg  BID. His main concern on last visit was significant left arm pain that appeared musculoskeletal with reproducible pain with certain positions. He has completed  physical therapy and takes low dose Gabapentin 300mg  qhs with improvement in pain. We discussed finishing his prescription for Gabapentin and monitoring if pain recurs off medication, continue home PT exercises. He continues to report numbness in the 2nd and 3rd digits of left hand, proceed with EMG/NCV as scheduled. He does not drive. Follow-up in 6 months, call for any changes.    Thank you for allowing me to participate in his care.  Please do not hesitate to call for any questions or concerns.    , M.D.   CC: , FNP

## 2022-01-29 ENCOUNTER — Ambulatory Visit: Payer: Medicaid - Out of State | Admitting: Neurology

## 2022-01-29 DIAGNOSIS — G540 Brachial plexus disorders: Secondary | ICD-10-CM

## 2022-01-29 DIAGNOSIS — M79602 Pain in left arm: Secondary | ICD-10-CM

## 2022-01-29 NOTE — Procedures (Signed)
[facility]  [address] [place], [address]  [place], [place] [address] Tel: [phone] Fax: [phone] Test Date:  01/29/2022  Patient: Kenneth Cross DOB: 12-16-1975 Physician: Jacquelyne Balint, MD  Sex: Male Height: 5\' 10"  Ref Phys: , MD  ID#: [id number]   Technician:    History: This is a 46 year old male with left arm pain.  NCV & EMG Findings: Extensive electrodiagnostic evaluation of the left upper limb with additional nerve conduction studies of the right upper limb shows: Left median sensory responses shows prolonged distal peak latency (3.7 ms) and reduced amplitude (8 V). Bilateral lateral antebrachial cutaneous, left radial, and left ulnar sensory responses show reduced amplitudes (L LAC 6, R LAC 12, L radial 7, L ulnar 5 V). Left median (APB) and ulnar (ADM) motor responses are within normal limits. Chronic motor axon loss changes with active denervation changes are seen in the left deltoid muscle. All other tested muscles are within normal limits with normal motor unit configuration and recruitment patterns.  Impression: This is a complex electrodiagnostic evaluation. 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 Chronic motor axon loss changes with active denervation changes seen in the left deltoid muscle are likely due to #1 above, but cannot exclude an overlapping left axillary mononeuropathy. Prolonged distal latency of the left median sensory response may be due to #1 above or reflect an overlapping left median mononeuropathy at or distal to the wrist, consistent with carpal tunnel syndrome, which would be mild in degree electrically. No definite electrodiagnostic evidence of a left cervical (C5-T1) motor  radiculopathy.    ___________________________ 57, MD    Nerve Conduction Studies Motor Nerve Results    Latency Amplitude F-Lat Segment Distance CV Comment  Site (ms) Norm (mV) Norm (ms)  (cm) (m/s) Norm   Left Median (APB) Motor  Wrist 3.6  < 3.9 6.3  > 6.0        Elbow 8.4 - 5.9 -  Elbow-Wrist 25 52  > 50   Left Ulnar (ADM) Motor  Wrist 2.1  < 3.1 10.8  > 7.0        Bel elbow 6.3 - 9.7 -  Bel elbow-Wrist 23 55  > 50   Ab elbow 8.4 - 9.5 -  Ab elbow-Bel elbow 10 48 -    Sensory Sites    Neg Peak Lat Amplitude (O-P) Segment Distance Velocity Comment  Site (ms) Norm (V) Norm  (cm) (ms)   Left Lateral Antebrachial Cutaneous Sensory  Lat biceps-Lat forearm 2.9  < 2.9 *6  > 14 Lat biceps-Lat forearm 12    Right Lateral Antebrachial Cutaneous Sensory  Lat biceps-Lat forearm 2.7  < 2.9 *12  > 14 Lat biceps-Lat forearm 12    Left Medial Antebrachial Cutaneous Sensory  Elbow-Med forearm 2.7  < 3.2 5  > 5 Elbow-Med forearm 12    Right Medial Antebrachial Cutaneous Sensory  Elbow 2.7  < 3.2 11  > 5 Elbow-Med forearm 12    Left Median Sensory  Wrist-Dig II *3.7  < 3.4 *8  > 20 Wrist-Dig II 13    Right Median Sensory  Wrist-Dig II 3.3  < 3.4 23  > 20 Wrist-Dig II 13    Left Radial Sensory  Forearm-Wrist 2.3  <  2.7 *7  > 18 Forearm-Wrist 10    Right Radial Sensory  Forearm-Wrist 2.5  < 2.7 19  > 18 Forearm-Wrist 10    Left Ulnar Sensory  Wrist-Dig V 3.1  < 3.1 *5  > 12 Wrist-Dig V 11    Right Ulnar Sensory  Wrist-Dig V 3.1  < 3.1 15  > 12 Wrist-Dig V 11     Electromyography   Side Muscle Ins.Act Fibs Fasc Recrt Amp Dur Poly Activation Comment  Left FDI Nml Nml Nml Nml Nml Nml Nml Nml N/A  Left EIP Nml Nml Nml Nml Nml Nml Nml Nml N/A  Left FPL Nml Nml Nml Nml Nml Nml Nml Nml N/A  Left APB Nml Nml Nml Nml Nml Nml Nml Nml N/A  Left Pronator teres Nml Nml Nml Nml Nml Nml Nml Nml N/A  Left Biceps Nml Nml Nml Nml Nml Nml Nml Nml N/A  Left Triceps Nml Nml Nml Nml Nml Nml  Nml Nml N/A  Left Deltoid Nml *2+ Nml *2- Nml *2+ *2+ Nml N/A  Left Infraspin Nml Nml Nml Nml Nml Nml Nml Nml N/A      Waveforms:  Motor      Sensory

## 2022-02-12 ENCOUNTER — Telehealth: Payer: Self-pay

## 2022-02-12 DIAGNOSIS — G540 Brachial plexus disorders: Secondary | ICD-10-CM

## 2022-02-12 DIAGNOSIS — M79602 Pain in left arm: Secondary | ICD-10-CM

## 2022-02-12 NOTE — Telephone Encounter (Signed)
Pt called an informed that the nerve and muscle test showed changes in the nerves around his shoulder level, Dr Karel Jarvis spoke to Dr. Loleta Chance who did his nerve test and he recommends doing an MRI brachial plexus with and without contrast to further evaluate the nerves.

## 2022-02-12 NOTE — Telephone Encounter (Signed)
-----   Message from Van Clines, MD sent at 02/09/2022  4:46 PM EST ----- Pls let him know the nerve and muscle test showed changes in the nerves around his shoulder level, I spoke to Dr. Loleta Chance who did his nerve test and he recommends doing an MRI brachial plexus with and without contrast to further evaluate the nerves. Thanks

## 2022-03-08 ENCOUNTER — Other Ambulatory Visit: Payer: Self-pay | Admitting: Neurology

## 2022-03-08 DIAGNOSIS — G540 Brachial plexus disorders: Secondary | ICD-10-CM

## 2022-03-08 DIAGNOSIS — M79602 Pain in left arm: Secondary | ICD-10-CM

## 2022-03-26 ENCOUNTER — Other Ambulatory Visit: Payer: Medicaid - Out of State

## 2022-04-26 NOTE — Progress Notes (Signed)
HISTORY AND PHYSICAL     CC:  follow up. Requesting Provider:  Dorothyann Peng, FNP  HPI: This is a 47 y.o. male who is here today for follow up for left popliteal artery thrombus.   He was initially seen in the hospital and March 2023 with bilateral PE, right heart strain, left popliteal artery thrombus.  On evaluation, he was asymptomatic, therefore I elected not to intervene on the left leg.  He has been followed in the outpatient setting since that time.  Roughly 6 months ago, ABI was normal on the right, 0.7 on the left.  He remained asymptomatic, working to recover from his stroke which significantly debilitated his left arm and leg.  Exam today, Nicki Reaper he was doing well, he remains in a wheelchair, and continues to struggle with some mobility. He denies symptoms of claudication, ischemic rest pain, tissue loss. Main concern was removal of his IVC filter was initially placed by IR at the time of his previous hospitalization.   Pt was continued on Eliquis.   The pt is on a statin for cholesterol management.    The pt is not on an aspirin.    Other AC:  Eliquis The pt is not on medication for hypertension.  The pt does not have diabetes. 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   Blood transfusion declined because patient is Jehovah's Witness 05/21/2021   Microcytic anemia 05/21/2021   Hypothyroidism 05/21/2021   Hyperlipidemia 05/21/2021   Essential hypertension 05/21/2021   Pulmonary embolism ([place]) 05/19/2021   Acute deep vein thrombosis (DVT) of popliteal vein of both lower extremities (HCC)    LGI bleed      Past Surgical History:  Procedure Laterality Date   IR IVC FILTER PLMT / S&I /IMG GUID/MOD SED  05/19/2021    Allergies  Allergen Reactions   Whole Blood      [facility] - declines blood or blood products, will accept fractions such as albumin ,hemoglobin    Current Outpatient Medications  Medication Sig Dispense Refill   allopurinol (ZYLOPRIM) 300 MG tablet Take 300 mg by mouth daily.     apixaban (ELIQUIS) 5 MG TABS tablet Take 1 tablet (5 mg total) by mouth 2 (two) times daily. START ON 05/29/21, ONLY AFTER THE '10MG'$  DOSAGE HAS COMPLETED 60 tablet 1   ascorbic acid (VITAMIN C) 500 MG tablet Take 1 tablet (500 mg total) by mouth daily. 30 tablet 0   atorvastatin (LIPITOR) 10 MG tablet Take 1 tablet (10 mg total) by mouth daily. 30 tablet 1   colchicine 0.6 MG tablet Take 0.6 mg by mouth daily as needed.     ferrous sulfate 325 (65 FE) MG tablet Take 1 tablet (325 mg total) by mouth 2 (two) times daily with a meal. 60 tablet 3   gabapentin (NEURONTIN) 300 MG capsule Take 1 capsule every night 30 capsule 6   hydrochlorothiazide (HYDRODIURIL) 12.5 MG tablet Take 12.5 mg by mouth daily.     levocetirizine (XYZAL) 5 MG tablet Take 5 mg by mouth every evening.     levothyroxine (SYNTHROID) 25 MCG tablet Take 1 tablet (25 mcg total) by mouth daily. 30 tablet 1   mirtazapine (REMERON) 15 MG tablet Take 15 mg by mouth at bedtime as needed (sleep).  Multiple Vitamins-Minerals ([employer] SPECTRAVITE ULTRA MENS) TABS Take 1 tablet by mouth daily.     neomycin-bacitracin-polymyxin (NEOSPORIN) ointment Apply topically 2 (two) times daily. 15 g 0   oxcarbazepine (TRILEPTAL) 600 MG tablet Take 600 mg by mouth 2 (two) times daily.     oxyCODONE-acetaminophen (PERCOCET) 7.5-325 MG tablet Take 1 tablet by mouth every 8 (eight) hours as needed for severe pain. 30 tablet 0   pantoprazole (PROTONIX) 40 MG tablet Take 1 tablet (40 mg total) by mouth daily at 12 noon. 30 tablet 1   polyethylene glycol (MIRALAX / GLYCOLAX) 17 g packet Take 17 g by mouth daily as needed for moderate constipation. 14 each 0   propranolol (INDERAL) 10 MG tablet Take 1 tablet (10 mg total) by mouth 2  (two) times daily. 60 tablet 1   No current facility-administered medications for this visit.    Family Hx:  denies family hx of blood clots.  Social History   Socioeconomic History   Marital status: Single    Spouse name: Not on file   Number of children: Not on file   Years of education: Not on file   Highest education level: Not on file  Occupational History   Not on file  Tobacco Use   Smoking status: Never    Passive exposure: Never   Smokeless tobacco: Never  Vaping Use   Vaping Use: Never used  Substance and Sexual Activity   Alcohol use: Yes   Drug use: Never   Sexual activity: Never  Other Topics Concern   Not on file  Social History Narrative   Right handed   Live alone one story    Caffeine rarely   Social Determinants of Health   Financial Resource Strain: Not on file  Food Insecurity: Not on file  Transportation Needs: Not on file  Physical Activity: Not on file  Stress: Not on file  Social Connections: Not on file  Intimate Partner Violence: Not on file     REVIEW OF SYSTEMS:   '[X]'$  denotes positive finding, '[ ]'$  denotes negative finding Cardiac  Comments:  Chest pain or chest pressure:    Shortness of breath upon exertion:    Short of breath when lying flat:    Irregular heart rhythm:        Vascular    Pain in calf, thigh, or hip brought on by ambulation:    Pain in feet at night that wakes you up from your sleep:     Blood clot in your veins:    Leg swelling:         Pulmonary    Oxygen at home:    Productive cough:     Wheezing:         Neurologic    Sudden weakness in arms or legs:     Sudden numbness in arms or legs:     Sudden onset of difficulty speaking or slurred speech:    Temporary loss of vision in one eye:     Problems with dizziness:         Gastrointestinal    Blood in stool:     Vomited blood:         Genitourinary    Burning when urinating:     Blood in urine:        Psychiatric    Major depression:          Hematologic    Bleeding problems:    Problems with blood clotting too  easily:        Skin    Rashes or ulcers:        Constitutional    Fever or chills:      PHYSICAL EXAMINATION:  There were no vitals filed for this visit.  There is no height or weight on file to calculate BMI.   General:  WDWN in NAD; vital signs documented above Gait: Not observed HENT: WNL, normocephalic Pulmonary: normal non-labored breathing , without wheezing Cardiac: regular HR, without carotid bruits Abdomen: soft, NT, no masses Skin: without rashes Vascular Exam/Pulses: Brisk triphasic right ulnar doppler signal, left radial pulse is palpable Brisk right AT/PT doppler signals; monophasic left AT and brisk triphasic left PT doppler signal Extremities: without ischemic changes, without Gangrene , without cellulitis; without open wounds Musculoskeletal: no muscle wasting or atrophy  Neurologic: A&O X 3 Psychiatric:  The pt has Normal affect.   Non-Invasive Vascular Imaging:   ABI's/TBI's on 06/30/2021: Right:  1.17/1.08 - Great toe pressure: 126 Left:  0.7/0.54 - Great toe pressure: 63   Previous arterial duplex on 05/19/2021: Left: Total occlusion noted in the popliteal artery, TP trunk, and  peroneal artery. Acute-appearing thrombus noted in the mid to distal  popliteal artery with collateralized, monophasic flow to the posterior  tibial and anterior tibial arteries.   ASSESSMENT/PLAN:: 47 y.o. 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  -ABI on the right normal and about 80% on the left.  He does have a multiphasic left PT doppler signal.  Bilateral feet are warm and well perfused.  He does not have rest pain and does not mobilize enough to elicit claudication.  He does not have any wounds on his feet.  Will have him return in 12 months with repeat  ABI -he will continue Eliquis for his DVT/PE.   -continue Lipitor  Results I am happy to take out his IVC filter at his convenience, refer him back to interventional radiology for filter removal as they placed it.Marland Kitchen  He stated he would like to go home and think about it.   Broadus John MD

## 2022-04-27 ENCOUNTER — Ambulatory Visit (INDEPENDENT_AMBULATORY_CARE_PROVIDER_SITE_OTHER): Payer: Medicaid - Out of State | Admitting: Vascular Surgery

## 2022-04-27 ENCOUNTER — Telehealth: Payer: Self-pay

## 2022-04-27 ENCOUNTER — Encounter: Payer: Self-pay | Admitting: Vascular Surgery

## 2022-04-27 ENCOUNTER — Ambulatory Visit (HOSPITAL_COMMUNITY)
Admission: RE | Admit: 2022-04-27 | Discharge: 2022-04-27 | Disposition: A | Discharge status: Home / Self Care | Payer: Medicaid - Out of State | Source: Ambulatory Visit | Attending: Vascular Surgery

## 2022-04-27 VITALS — BP 129/88 | HR 86 | Temp 98.3°F | Resp 20 | Ht 70.0 in | Wt 170.8 lb

## 2022-04-27 DIAGNOSIS — Z95828 Presence of other vascular implants and grafts: Secondary | ICD-10-CM

## 2022-04-27 DIAGNOSIS — I70202 Unspecified atherosclerosis of native arteries of extremities, left leg: Secondary | ICD-10-CM | POA: Insufficient documentation

## 2022-04-27 LAB — VAS US ABI WITH/WO TBI
Left ABI: 0.81
Right ABI: 1.15

## 2022-04-27 NOTE — Telephone Encounter (Signed)
Patient instructed by Dr. Osie Cheeks to call our office if he would like [place] to remove his IVC filter. Patient states he will call back once he is ready to schedule.

## 2022-07-17 ENCOUNTER — Ambulatory Visit (INDEPENDENT_AMBULATORY_CARE_PROVIDER_SITE_OTHER): Payer: Medicaid - Out of State | Admitting: Neurology

## 2022-07-17 ENCOUNTER — Encounter: Payer: Self-pay | Admitting: Neurology

## 2022-07-17 VITALS — BP 133/88 | HR 88 | Resp 18 | Ht 70.0 in | Wt 180.0 lb

## 2022-07-17 DIAGNOSIS — G540 Brachial plexus disorders: Secondary | ICD-10-CM

## 2022-07-17 DIAGNOSIS — M79602 Pain in left arm: Secondary | ICD-10-CM

## 2022-07-17 NOTE — Patient Instructions (Signed)
Good to see you doing better. Continue physical therapy. Proceed with [facility] plans and treatment as recommended. Follow-up in 6-8 months, call for any changes.

## 2022-07-17 NOTE — Progress Notes (Unsigned)
NEUROLOGY FOLLOW UP OFFICE NOTE  Nathanael Saad Bransfield [id number] 06-13-1975  HISTORY OF PRESENT ILLNESS: I had the pleasure of seeing Gegory Baldassari in follow-up in the neurology clinic on 07/17/2022. He is alone in the office today. The patient was last seen 7 months ago. He has a history of cerebellar ataxia, seizures, tremors with unclear etiology. He is on oxcarbazepine 600mg  BID for seizure prophylaxis. He reports a seizure in the past month where he had "real bad spasms," no loss of consciousness. He bit his tongue, no incontinence. He states he can go 6-8 months seizure-free. He was seen at [facility] yesterday for ataxia, comprehensive note reviewed. He had bloodwork for anti-TPO, anti-TG Ab, ENS2, with plans for lumbar puncture.   Main concern in our office has been left arm pain. On last visit, he reported pain was not bothering him as much, but he continued to have numbness in the 2nd and 3rd digits. 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 There was chronic motor axon loss changes with active denervation in the left deltoid, likely due to above but could not exclude an overlapping left axillary mononeuropathy. There was also mild carpal tunnel syndrome. MRI of the brachial plexus was ordered but has not been done due to insurance coverage. He has stopped the Gabapentin, it was not doing much for him. He is still having pain on the left side but it is not very bad. He feels he is slowly getting better but still not very good on the left side. The top part of his left shoulder is hurting. He describes tightness/discomfort, and does not take any medication. He has not been able to do home PT exercises and has 2 more PT sessions left. He lives alone. He notes falls a couple of times a week, he typically holds on to objects at home to get around.    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 He has been followed for many years at the [facility] clinic in [place], [place], records were reviewed. His last visit with neurologist Dr. Orion Modest was in 01/2021. 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 He initially had mild symptoms with balance that progressed to both appendicular and axial ataxia. He also has episodes of dystonic events with spasms associated with movement and abnormal posture. He has had EEGs which did not show any EEG correlate during the episodes, however the possibility of this being myoclonic were considered and he has been on oxcarbazepine 600mg  BID.   He presents today mostly for left arm pain that started while he was admitted at [facility] last March 2023. Records indicate he started having left leg cramping and dyspnea on exertion on 3/19 that progressively worsened. At that time, he also complained of left arm pain and stiffness. He was found to have bilateral pulmonary emboli with right hear strain. He had an IVC filter placed and started on Eliquis. During his admission, on 3/29, he complained of waking up at 3am with left-sided weakness, numbness, and occasional pain in the left arm. MRI brain and cervical spine did not show any acute changes, mild foraminal narrowing bilaterally at C5-6 due to spurring. He was  evaluated by Neurology who suspected pain is musculoskeletal as it is reproducible by deep palpation of his left upper arm and lateral forearm. Numbness felt due to neuropathy. Today he states that certain movements when he extends his arm triggers shooting pain into his left hand and fingers. While he was in the hospital, he was mostly having dull numbness on the left arm and leg, his elbow had been hurting  and the shooting pain was in the thumb. He has been doing Occupational Therapy and his shoulder was taped because he was told his shoulder is out of socket, however he states that after his last OT session yesterday, shooting pain got worse, now going straight to his fingers and thumb. Symptoms are a whole lot worse in the morning and get better throughout the day. He falls frequently but does not recall a fall occurring just prior to this pain. He states he usually falls on his right side because that is his stronger side. Sometimes both legs go out.   He reports oxcarbazepine helps with tremors and seizures. He would drink coke and "shake like crazy." Caffeine and tea does not tend to do the same. Most of the the time he can tell when he is about to have a seizure, he starts getting a drunk feeling "like in the ocean" and tries to get to a safe place. He would take Baclofen and would be fine. He was previously having seizures every 2 weeks to 4 months, but he has not had any in a year now, he cannot recall the last time. He lives alone. His parents live close by and help him. He has a walker and uses a wheelchair when traveling. At home he holds on to furniture. He reports he can get up and walk some days, other days he cannot even use his walker, especially if sleep-deprived or tired. He usually gets a couple of hours sleep at a time and takes prn Remeron. He naps during the day. He recalls having a sleep study 4-5 years ago but did not sleep during the test. He has occasional difficulty swallowing, his sinuses drain and he starts hacking. He denies any neck pain. In the past it was felt that his ataxia may be alcohol-related. He states that he has not had much alcohol since he was discharged from the hospital 2 months ago, he states he can count the beers he has had in the last 4 months. His mother and brother had seizures in childhood. He has passed out with some of the falls but denies any neurosurgical  procedures. No history of febrile convulsions, CNS infections such as meningitis/encephalitis.  He has had extensive evaluations over the years: Per records, negative testing for anti-Hu, anti-Jo, anti-Ro, anti-GAD antibodies, very long chain fatty acids, FTA-ABS/RPR, vitamin B1, B12, thiamine, folate, CK, vitamin D, ceruloplasmin, SPEP, amino acid analysis, celiac panel, ANA, copper, zinc, organic acids, thyroglobulin, TPO, Lyme, HTLV 1/2, HIV, paraneoplastic panel. Genetic panel considered but cost-prohibitive.   MRI with and without contrast in 12/2018 reported moderate diffuse prominence of the ventricular system and extra-axial spaces of the supratentorial compartment as well as the cerebellum, compatible with moderately extensive diffuse parenchymal volume loss without a specific distribution, significantly greater than expected for stated age. Although the appearances nonspecific, given the patient's history of alcohol abuse, it is noted that these findings may be seen in the setting of significant alcohol abuse. No definite hydrocephalus.   EMU 06/22/14-06/25/14 normal. Typical events were not captured.  EEG in 11/2018 reported mild to moderate amount of low to medium voltage, polymorphic 2-6 Hz slow wave activity seen diffusely over both hemispheres with frontal emphasis.  EMG/NCV 08/2017 normal both UE and LE.   PAST MEDICAL HISTORY: Past Medical History:  Diagnosis Date   DVT (deep venous thrombosis) (HCC)    Seizures (HCC)     MEDICATIONS: Current Outpatient Medications on File Prior to Visit  Medication Sig Dispense Refill   allopurinol (ZYLOPRIM) 300 MG tablet Take 300 mg by mouth daily.     apixaban (ELIQUIS) 5 MG TABS tablet Take 1 tablet (5 mg total) by mouth 2 (two) times daily. START ON 05/29/21, ONLY AFTER THE 10MG  DOSAGE HAS COMPLETED 60 tablet 1   ascorbic acid (VITAMIN C) 500 MG tablet Take 1 tablet (500 mg total) by mouth daily. 30 tablet 0   atorvastatin (LIPITOR) 10  MG tablet Take 1 tablet (10 mg total) by mouth daily. 30 tablet 1   colchicine 0.6 MG tablet Take 0.6 mg by mouth daily as needed.     ferrous sulfate 325 (65 FE) MG tablet Take 1 tablet (325 mg total) by mouth 2 (two) times daily with a meal. 60 tablet 3   gabapentin (NEURONTIN) 300 MG capsule Take 1 capsule every night 30 capsule 6   hydrochlorothiazide (HYDRODIURIL) 12.5 MG tablet Take 12.5 mg by mouth daily.     levocetirizine (XYZAL) 5 MG tablet Take 5 mg by mouth every evening.     levothyroxine (SYNTHROID) 25 MCG tablet Take 1 tablet (25 mcg total) by mouth daily. 30 tablet 1   mirtazapine (REMERON) 15 MG tablet Take 15 mg by mouth at bedtime as needed (sleep).     Multiple Vitamins-Minerals ([employer]S SPECTRAVITE ULTRA MENS) TABS Take 1 tablet by mouth daily.     neomycin-bacitracin-polymyxin (NEOSPORIN) ointment Apply topically 2 (two) times daily. 15 g 0   oxcarbazepine (TRILEPTAL) 600 MG tablet Take 600 mg by mouth 2 (two) times daily.     oxyCODONE-acetaminophen (PERCOCET) 7.5-325 MG tablet Take 1 tablet by mouth every 8 (eight) hours as needed for severe pain. 30 tablet 0   pantoprazole (PROTONIX) 40 MG tablet Take 1 tablet (40 mg total) by mouth daily at 12 noon. 30 tablet 1   polyethylene glycol (MIRALAX / GLYCOLAX) 17 g packet Take 17 g by mouth daily as needed for moderate constipation. 14 each 0   propranolol (INDERAL) 10 MG tablet Take 1 tablet (10 mg total) by mouth 2 (two) times daily. 60 tablet 1   No current facility-administered medications on file prior to visit.    ALLERGIES: Allergies  Allergen Reactions   Whole Blood     [facility]'s - declines blood or blood products, will accept fractions such as albumin ,hemoglobin    FAMILY HISTORY: Family History  Problem Relation Age of Onset   High blood pressure Father    Broken bones Father     SOCIAL HISTORY: Social History   Socioeconomic History   Marital status: Single    Spouse name: Not on file   Number of  children: Not on file   Years of education: Not on file   Highest education level: Not on file  Occupational History   Not on file  Tobacco Use   Smoking status: Never    Passive exposure: Never   Smokeless tobacco: Never  Vaping Use   Vaping Use: Never used  Substance and Sexual Activity   Alcohol use: Yes   Drug use: Never  Sexual activity: Never  Other Topics Concern   Not on file  Social History Narrative   Right handed   Live alone one story    Caffeine rarely   Social Determinants of Health   Financial Resource Strain: Not on file  Food Insecurity: Not on file  Transportation Needs: Not on file  Physical Activity: Not on file  Stress: Not on file  Social Connections: Not on file  Intimate Partner Violence: Not on file     PHYSICAL EXAM: Vitals:   07/17/22 1356  BP: 133/88  Pulse: 88  Resp: 18  SpO2: 98%   General: No acute distress Head:  Normocephalic/atraumatic Skin/Extremities: No rash, no edema Neurological Exam: alert and awake. No aphasia or dysarthria. Fund of knowledge is appropriate.   Attention and concentration are normal.   Cranial nerves: Pupils equal, round. Extraocular movements intact with no nystagmus. Visual fields full.  No facial asymmetry.  Motor: Bulk and tone normal, muscle strength 5/5 throughout with no pronator drift.   +ataxia on bilateral finger to nose, heel to shin, and rapid alternating movements. Gait not tested, he is in a wheelchair today.    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 He had been seeing Neurology at [facility] clinic for many years for the cerebellar ataxia, seizures, tremors with unclear etiology, and was seen at [facility] yesterday with further testing planned. Seizures overall stable on oxcarbazepine 600mg  BID prescribed by his PCP. Main concern in our office has been the left arm pain which he reports  is better. 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 There was chronic motor axon loss changes with active denervation in the left deltoid, likely due to above but could not exclude an overlapping left axillary mononeuropathy. There was also mild carpal tunnel syndrome. MRI of the brachial plexus was ordered but has not been done due to insurance coverage. Since symptoms are improving, he was encouraged to continue with home PT exercises. Continue follow-up with [facility] for ataxia. He would like to continue care here as well due to distance, follow-up in 6-8 months, call for any changes.     Thank you for allowing me to participate in his care.  Please do not hesitate to call for any questions or concerns.    Patrcia Dolly, M.D.   CC: Prince Solian, FNP

## 2023-03-05 ENCOUNTER — Ambulatory Visit: Payer: Self-pay | Admitting: Neurology

## 2023-03-06 NOTE — Telephone Encounter (Signed)
 I called to speak to Kenneth Cross in regards to their upcoming appointment with [facility]. I was able to reach them and they decided to switch to a telemed appointment. They have not had previous genetic testing.     Andriette Endo, B.A.  Genetic Counseling Assistant   [facility]   P: [phone]  C: [phone]  E: [email or web address]

## 2023-09-27 ENCOUNTER — Encounter: Payer: Self-pay | Admitting: Neurology

## 2023-09-27 ENCOUNTER — Ambulatory Visit (INDEPENDENT_AMBULATORY_CARE_PROVIDER_SITE_OTHER): Payer: Self-pay | Admitting: Neurology

## 2023-09-27 VITALS — BP 119/80 | HR 83 | Ht 70.0 in | Wt 169.0 lb

## 2023-09-27 DIAGNOSIS — G894 Chronic pain syndrome: Secondary | ICD-10-CM

## 2023-09-27 DIAGNOSIS — G119 Hereditary ataxia, unspecified: Secondary | ICD-10-CM | POA: Diagnosis not present

## 2023-09-27 DIAGNOSIS — M79602 Pain in left arm: Secondary | ICD-10-CM | POA: Diagnosis not present

## 2023-09-27 NOTE — Progress Notes (Signed)
 NEUROLOGY FOLLOW UP OFFICE NOTE  Kenneth Cross [id number] [birth year]  HISTORY OF PRESENT ILLNESS: I had the pleasure of seeing Kenneth Cross in follow-up in the neurology clinic on 09/27/2023.  The patient was last seen a year ago. He is alone in the office today. Records and images were personally reviewed where available. He has a complicated history of a degenerative cerebellar syndrome and sees Movement Disorders specialist Dr. Stevie at [facility] for symptoms most c/w ataxia. 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 He has been on oxcarbazepine  but since it can worsen myoclonus  and he reported more spasms with reduction in Keppra, Keppra was increased to 750mg  BID and Oxcarbazepine  reduced to 300mg  BID last 05/2023. Today he states he has still been taking the Oxcarbazepine  600mg  BID and that he did not tolerate 750mg  BID Keppra, currently taking 500mg  BID. He states it is the Baclofen that was stopped . For the past 1.5-2 weeks, he wobbles to the point he cannot walk. He shakes all the time when trying to stand. He has tremors when eating. He attributes this to the weather, there was a whole lot of shaking the past 3 days that he was not sleeping at all, then he slept for 14 hours.   He had been following in our clinic for left arm pain that started while at  [facility] in 2023. 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 There was chronic motor axon loss changes with active denervation in the left deltoid, likely due to above but could not exclude an overlapping left axillary mononeuropathy. There was also mild carpal tunnel syndrome. MRI of the brachial plexus was ordered but has not been done due to insurance coverage. He was initially on Gabapentin  but stopped it due to side effects. He states his left arm is alright, he still does not have good motor function,  the upper arm muscle stays tight, but the pain and numbness are a lot better. A week ago, his left hand and wrist could not move due to pain that improved in 3 hours. He has chronic back pain, his legs feel weak and wobbly. For the past 1.5-2 weeks, he wobbles to where he cannot walk, has the shakes all the time when he tries to stand. He holds on to things at home. His knees hurt so bad as well. He asks about taking an addition Baclofen for these. He has Percocet that he takes every so often. No recent falls.    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 He has been followed for many years at the Neurology [facility] clinic in [place], [place], records were reviewed. His last visit with neurologist Dr. Zenda was in 01/2021. 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 He initially had mild symptoms with balance that progressed to both appendicular and axial ataxia. He also has episodes of dystonic events with spasms associated with movement and abnormal posture. He has had EEGs which did not show any EEG correlate during the episodes, however the possibility of this being myoclonic were considered and he has been on oxcarbazepine  600mg  BID.   He presents today mostly for left arm pain that  started while he was admitted at [facility] last March 2023. Records indicate he started having left leg cramping and dyspnea on exertion on 3/19 that progressively worsened. At that time, he also complained of left arm pain and stiffness. He was found to have bilateral pulmonary emboli with right hear strain. He had an IVC filter placed and started on Eliquis . During his admission, on 3/29, he complained of waking up at 3am with left-sided weakness, numbness, and occasional pain in the  left arm. MRI brain and cervical spine did not show any acute changes, mild foraminal narrowing bilaterally at C5-6 due to spurring. He was evaluated by Neurology who suspected pain is musculoskeletal as it is reproducible by deep palpation of his left upper arm and lateral forearm. Numbness felt due to neuropathy. Today he states that certain movements when he extends his arm triggers shooting pain into his left hand and fingers. While he was in the hospital, he was mostly having dull numbness on the left arm and leg, his elbow had been hurting and the shooting pain was in the thumb. He has been doing Occupational Therapy and his shoulder was taped because he was told his shoulder is out of socket, however he states that after his last OT session yesterday, shooting pain got worse, now going straight to his fingers and thumb. Symptoms are a whole lot worse in the morning and get better throughout the day. He falls frequently but does not recall a fall occurring just prior to this pain. He states he usually falls on his right side because that is his stronger side. Sometimes both legs go out.   He reports oxcarbazepine  helps with tremors and seizures. He would drink coke and shake like crazy. Caffeine and tea does not tend to do the same. Most of the the time he can tell when he is about to have a seizure, he starts getting a drunk feeling like in the ocean and tries to get to a safe place. He would take Baclofen and would be fine. He was previously having seizures every 2 weeks to 4 months, but he has not had any in a year now, he cannot recall the last time. He lives alone. His parents live close by and help him. He has a walker and uses a wheelchair when traveling. At home he holds on to furniture. He reports he can get up and walk some days, other days he cannot even use his walker, especially if sleep-deprived or tired. He usually gets a couple of hours sleep at a time and takes prn Remeron . He naps  during the day. He recalls having a sleep study 4-5 years ago but did not sleep during the test. He has occasional difficulty swallowing, his sinuses drain and he starts hacking. He denies any neck pain. In the past it was felt that his ataxia may be alcohol-related. He states that he has not had much alcohol since he was discharged from the hospital 2 months ago, he states he can count the beers he has had in the last 4 months. His mother and brother had seizures in childhood. He has passed out with some of the falls but denies any neurosurgical procedures. No history of febrile convulsions, CNS infections such as meningitis/encephalitis.  He has had extensive evaluations over the years: Per records, negative testing for anti-Hu, anti-Jo, anti-Ro, anti-GAD antibodies, very long chain fatty acids, FTA-ABS/RPR, vitamin B1, B12, thiamine, folate, CK, vitamin D, ceruloplasmin, SPEP, amino acid analysis, celiac panel,  ANA, copper, zinc, organic acids, thyroglobulin, TPO, Lyme, HTLV 1/2, HIV, paraneoplastic panel. Genetic panel considered but cost-prohibitive.   MRI with and without contrast in 12/2018 reported moderate diffuse prominence of the ventricular system and extra-axial spaces of the supratentorial compartment as well as the cerebellum, compatible with moderately extensive diffuse parenchymal volume loss without a specific distribution, significantly greater than expected for stated age. Although the appearances nonspecific, given the patient's history of alcohol abuse, it is noted that these findings may be seen in the setting of significant alcohol abuse. No definite hydrocephalus.   EMU 06/22/14-06/25/14 normal. Typical events were not captured.   EEG in 11/2018 reported mild to moderate amount of low to medium voltage, polymorphic 2-6 Hz slow wave activity seen diffusely over both hemispheres with frontal emphasis.  EMG/NCV 08/2017 normal both UE and LE.  EMG/NCV 01/2022:  Evidence of a left  brachial plexopathy involving at least the left upper and lower trunks, axon loss in type, mild in degree electrically, as evidenced by the asymmetric sensory responses between left and right upper limb. Chronic motor axon loss changes with active denervation changes seen in the left deltoid muscle are likely due to #1 above, but cannot exclude an overlapping left axillary mononeuropathy. Prolonged distal latency of the left median sensory response may be due to #1 above or reflect an overlapping left median mononeuropathy at or distal to the wrist, consistent with carpal tunnel syndrome, which would be mild in degree electrically.   PAST MEDICAL HISTORY: Past Medical History:  Diagnosis Date   DVT (deep venous thrombosis) (HCC)    Seizures (HCC)     MEDICATIONS: Current Outpatient Medications on File Prior to Visit  Medication Sig Dispense Refill   allopurinol  (ZYLOPRIM ) 300 MG tablet Take 300 mg by mouth daily.     apixaban  (ELIQUIS ) 5 MG TABS tablet Take 1 tablet (5 mg total) by mouth 2 (two) times daily. START ON 05/29/21, ONLY AFTER THE 10MG  DOSAGE HAS COMPLETED 60 tablet 1   ascorbic acid  (VITAMIN C) 500 MG tablet Take 1 tablet (500 mg total) by mouth daily. 30 tablet 0   atorvastatin  (LIPITOR) 10 MG tablet Take 1 tablet (10 mg total) by mouth daily. 30 tablet 1   ferrous sulfate  325 (65 FE) MG tablet Take 1 tablet (325 mg total) by mouth 2 (two) times daily with a meal. 60 tablet 3   hydrochlorothiazide (HYDRODIURIL) 12.5 MG tablet Take 12.5 mg by mouth daily.     levETIRAcetam (KEPPRA) 750 MG tablet Take 750 mg by mouth 2 (two) times daily.     levocetirizine (XYZAL) 5 MG tablet Take 5 mg by mouth every evening.     levothyroxine  (SYNTHROID ) 25 MCG tablet Take 1 tablet (25 mcg total) by mouth daily. 30 tablet 1   mirtazapine  (REMERON ) 15 MG tablet Take 15 mg by mouth at bedtime as needed (sleep).     montelukast (SINGULAIR) 10 MG tablet Take 10 mg by mouth daily.     Multiple  Vitamins-Minerals ([employer] SPECTRAVITE ULTRA MENS) TABS Take 1 tablet by mouth daily.     oxcarbazepine  (TRILEPTAL ) 600 MG tablet Take 600 mg by mouth 2 (two) times daily.     oxyCODONE -acetaminophen  (PERCOCET) 7.5-325 MG tablet Take 1 tablet by mouth every 8 (eight) hours as needed for severe pain. 30 tablet 0   polyethylene glycol (MIRALAX  / GLYCOLAX ) 17 g packet Take 17 g by mouth daily as needed for moderate constipation. 14 each 0   propranolol  (INDERAL ) 10  MG tablet Take 1 tablet (10 mg total) by mouth 2 (two) times daily. 60 tablet 1   colchicine 0.6 MG tablet Take 0.6 mg by mouth daily as needed. (Patient not taking: Reported on 09/27/2023)     neomycin -bacitracin -polymyxin (NEOSPORIN) ointment Apply topically 2 (two) times daily. (Patient not taking: Reported on 09/27/2023) 15 g 0   pantoprazole  (PROTONIX ) 40 MG tablet Take 1 tablet (40 mg total) by mouth daily at 12 noon. (Patient not taking: Reported on 09/27/2023) 30 tablet 1   No current facility-administered medications on file prior to visit.    ALLERGIES: Allergies  Allergen Reactions   Whole Blood     [facility] - declines blood or blood products, will accept fractions such as albumin ,hemoglobin    FAMILY HISTORY: Family History  Problem Relation Age of Onset   High blood pressure Father    Broken bones Father     SOCIAL HISTORY: Social History   Socioeconomic History   Marital status: Single    Spouse name: Not on file   Number of children: Not on file   Years of education: Not on file   Highest education level: Not on file  Occupational History   Not on file  Tobacco Use   Smoking status: Never    Passive exposure: Never   Smokeless tobacco: Never  Vaping Use   Vaping status: Never Used  Substance and Sexual Activity   Alcohol use: Yes   Drug use: Never   Sexual activity: Never  Other Topics Concern   Not on file  Social History Narrative   Right handed   Live alone one story    Caffeine rarely   Social  Drivers of Corporate investment banker Strain: Not on file  Food Insecurity: Not on file  Transportation Needs: Not on file  Physical Activity: Not on file  Stress: Not on file  Social Connections: Not on file  Intimate Partner Violence: Not on file     PHYSICAL EXAM: Vitals:   09/27/23 1437  BP: 119/80  Pulse: 83  SpO2: 97%   General: No acute distress, sitting on wheelchair Head:  Normocephalic/atraumatic Skin/Extremities: No rash, no edema Neurological Exam: alert and awake. No aphasia or dysarthria. Fund of knowledge is appropriate. Attention and concentration are normal.   Cranial nerves: Pupils equal, round. Extraocular movements intact with no nystagmus. Visual fields full.  No facial asymmetry.  Motor: Bulk and tone normal, muscle strength 5/5 throughout with no pronator drift.  Ataxia on left FTN.  Finger to nose testing intact.  Gait not tested. +narrow-based and steady, able to tandem walk adequately.  Romberg negative.   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 He had questions about medication changes made at [facility] and was advised to speak with his physician there about questions/concerns. He denies any further significant arm pain, he takes prn Percocet. We discussed that since left arm symptoms are improved, it is advisable to continue follow-up care for the ataxia at [facility]. He will be referred to Pain Management for chronic back pain. Follow-up as needed, call for any changes.   Thank you for allowing me to participate in his care.  Please do not hesitate to call for any questions or concerns.    Darice Shivers, M.D.  CC: Suzen Harding, NFP

## 2023-09-27 NOTE — Patient Instructions (Signed)
 Good to see you.  Referral will be sent to Pain Management in [place]  2. Continue follow-up with Dr. Stevie at the Movement Disorders clinic at [facility] and their geneticist  3. Follow-up as needed, call for any changes

## 2023-11-15 ENCOUNTER — Telehealth: Payer: Self-pay | Admitting: Neurology

## 2023-11-15 NOTE — Telephone Encounter (Signed)
 Pt called letting Dr.aquino know they have not received a call from anyone regarding a referral for pain management in [place]

## 2023-11-15 NOTE — Telephone Encounter (Signed)
 LVM-to pt will Re-sent referral form to [facility] -pain management to [place]. Faxed [phone].

## 2023-11-18 ENCOUNTER — Telehealth: Payer: Self-pay | Admitting: Vascular Surgery

## 2023-11-18 NOTE — Telephone Encounter (Signed)
 Spoke with the patient's mother. Patient was in the hospital in 2023 and had an IVC filter placed. She states that he was supposed to have it removed. Advised that she will need to contact the vein and vascular center.

## 2023-11-18 NOTE — Telephone Encounter (Signed)
 Pt's mother calling on behalf of pt. Pt's mother states that he had a watchman put in at hospital to stop blood from clotting and pt now needs to have this taken out. Please advise.

## 2023-11-18 NOTE — Telephone Encounter (Signed)
 Debbie returning call to schedule. Please advise.

## 2023-11-18 NOTE — Telephone Encounter (Signed)
 Left message for patients mother to call back.

## 2023-12-18 ENCOUNTER — Telehealth: Payer: Self-pay | Admitting: Neurology

## 2023-12-18 DIAGNOSIS — G894 Chronic pain syndrome: Secondary | ICD-10-CM

## 2023-12-18 NOTE — Telephone Encounter (Signed)
 Who's calling (name and relationship to patient) : Erbie Arment; mom  Best contact number: [phone]  Provider they see: Dr. Georjean  Reason for call: Mom called in to follow up on Referral for Pain management in [place]. She stated that she has not heard from anyone regarding referral.  She is requesting a call back.    Call ID:      PRESCRIPTION REFILL ONLY  Name of prescription:  Pharmacy:

## 2023-12-19 NOTE — Telephone Encounter (Signed)
 Pls let them know that [facility] in [place] sent back a message that they have nothing to offer him. Has he seen any other pain management specialists closer to him? If not, are they okay with referral to Pain Management in [place], thanks

## 2023-12-19 NOTE — Addendum Note (Signed)
 Addended by: TAFT MOATS on: 12/19/2023 04:11 PM   Modules accepted: Orders

## 2023-12-19 NOTE — Telephone Encounter (Signed)
 Pt stated that he is okay with coming to [place] for pain Management

## 2023-12-19 NOTE — Telephone Encounter (Signed)
 Pt called no answer left a voice mail to call the office back

## 2024-01-27 ENCOUNTER — Telehealth: Payer: Self-pay | Admitting: Neurology

## 2024-01-27 ENCOUNTER — Telehealth: Payer: Self-pay

## 2024-01-27 NOTE — Telephone Encounter (Signed)
 Patient is checking on the referral for the pain management and he would like it to be done in [place].

## 2024-01-27 NOTE — Telephone Encounter (Signed)
 Pls let patient know that unfortunately [facility] in [place] declined the referral. At this point, it may be better if he discuss referral to Pain Management with his neurologist at [facility] so it will be in their system. Thanks

## 2024-01-28 NOTE — Telephone Encounter (Signed)
 Pt called no answer left a voice mail to call the office back

## 2024-01-30 NOTE — Telephone Encounter (Signed)
 Pt called no answer left a voice mail to call the office back

## 2024-01-31 NOTE — Telephone Encounter (Signed)
 Pt called no answer left a voice mail to call the office back

## 2024-01-31 NOTE — Telephone Encounter (Signed)
 Letter mailed to pt.

## 2024-02-03 ENCOUNTER — Telehealth: Payer: Self-pay | Admitting: Neurology

## 2024-02-03 NOTE — Telephone Encounter (Signed)
 PT is returning a call to peter kiewit sons

## 2024-02-03 NOTE — Telephone Encounter (Signed)
 Pt called an informed unfortunately [facility] in [place] declined the referral. At this point, it may be better if he discuss referral to Pain Management with his neurologist at [facility] so it will be in their system.

## 2024-03-03 NOTE — Progress Notes (Unsigned)
 " Office Note     CC: IVC filter removal Requesting Provider:  Melanee Suzen RAMAN, FNP  HPI: Kenneth Cross is a 49 y.o. (Oct 12, 1975) male presenting at the request of .Gravely, Suzen RAMAN, FNP for consideration of IVC filter removal.  I saw the patient in the office last year.  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  On initial consultation in 2023, left popliteal artery thrombus was incidental.  He was sensorimotor intact with no rest pain.  He had a PE with a GI bleed and hemoglobin of 6.5.  He was [employer], and refusing blood products.  At that time we elected to watch his lower extremity which remained asymptomatic.  The pt is *** on a statin for cholesterol management.  The pt is *** on a daily aspirin.   Other AC:  *** The pt is *** on medication for hypertension.   The pt is *** diabetic.  Tobacco hx:  ***  Past Medical History:  Diagnosis Date   DVT (deep venous thrombosis) (HCC)    Seizures (HCC)     Past Surgical History:  Procedure Laterality Date   IR IVC FILTER PLMT / S&I /IMG GUID/MOD SED  05/19/2021    Social History   Socioeconomic History   Marital status: Single    Spouse name: Not on file   Number of children: Not on file   Years of education: Not on file   Highest education level: Not on file  Occupational History   Not on file  Tobacco Use   Smoking status: Never    Passive exposure: Never   Smokeless tobacco: Never  Vaping Use   Vaping status: Never Used  Substance and Sexual Activity   Alcohol use: Yes   Drug use: Never   Sexual activity: Never  Other Topics Concern   Not on file  Social History Narrative   Right handed   Live alone one story    Caffeine rarely   Social Drivers of Health   Tobacco Use: Low Risk (09/27/2023)   Patient History    Smoking Tobacco Use:  Never    Smokeless Tobacco Use: Never    Passive Exposure: Never  Financial Resource Strain: Not on file  Food Insecurity: Not on file  Transportation Needs: Not on file  Physical Activity: Not on file  Stress: Not on file  Social Connections: Not on file  Intimate Partner Violence: Not on file  Depression (EYV7-0): Not on file  Alcohol Screen: Not on file  Housing: Not on file  Utilities: Not on file  Health Literacy: Not on file   *** Family History  Problem Relation Age of Onset   High blood pressure Father    Broken bones Father     Current Outpatient Medications  Medication Sig Dispense Refill   allopurinol  (ZYLOPRIM ) 300 MG tablet Take 300 mg by mouth daily.     apixaban  (ELIQUIS ) 5 MG TABS tablet Take 1 tablet (5 mg total) by mouth 2 (two) times daily. START ON 05/29/21, ONLY AFTER THE 10MG  DOSAGE HAS COMPLETED 60 tablet 1   ascorbic acid  (VITAMIN C) 500 MG tablet Take 1 tablet (500 mg total) by mouth daily. 30 tablet 0   atorvastatin  (LIPITOR) 10 MG tablet Take 1 tablet (10 mg total) by mouth daily. 30 tablet 1  colchicine 0.6 MG tablet Take 0.6 mg by mouth daily as needed. (Patient not taking: Reported on 09/27/2023)     ferrous sulfate  325 (65 FE) MG tablet Take 1 tablet (325 mg total) by mouth 2 (two) times daily with a meal. 60 tablet 3   hydrochlorothiazide (HYDRODIURIL) 12.5 MG tablet Take 12.5 mg by mouth daily.     levETIRAcetam (KEPPRA) 750 MG tablet Take 750 mg by mouth 2 (two) times daily.     levocetirizine (XYZAL) 5 MG tablet Take 5 mg by mouth every evening.     levothyroxine  (SYNTHROID ) 25 MCG tablet Take 1 tablet (25 mcg total) by mouth daily. 30 tablet 1   mirtazapine  (REMERON ) 15 MG tablet Take 15 mg by mouth at bedtime as needed (sleep).     montelukast (SINGULAIR) 10 MG tablet Take 10 mg by mouth daily.     Multiple Vitamins-Minerals ([employer] SPECTRAVITE ULTRA MENS) TABS Take 1 tablet by mouth daily.     neomycin -bacitracin -polymyxin (NEOSPORIN) ointment  Apply topically 2 (two) times daily. (Patient not taking: Reported on 09/27/2023) 15 g 0   oxcarbazepine  (TRILEPTAL ) 600 MG tablet Take 600 mg by mouth 2 (two) times daily.     oxyCODONE -acetaminophen  (PERCOCET) 7.5-325 MG tablet Take 1 tablet by mouth every 8 (eight) hours as needed for severe pain. 30 tablet 0   pantoprazole  (PROTONIX ) 40 MG tablet Take 1 tablet (40 mg total) by mouth daily at 12 noon. (Patient not taking: Reported on 09/27/2023) 30 tablet 1   polyethylene glycol (MIRALAX  / GLYCOLAX ) 17 g packet Take 17 g by mouth daily as needed for moderate constipation. 14 each 0   propranolol  (INDERAL ) 10 MG tablet Take 1 tablet (10 mg total) by mouth 2 (two) times daily. 60 tablet 1   No current facility-administered medications for this visit.    Allergies[1]   REVIEW OF SYSTEMS:  *** [X]  denotes positive finding, [ ]  denotes negative finding Cardiac  Comments:  Chest pain or chest pressure:    Shortness of breath upon exertion:    Short of breath when lying flat:    Irregular heart rhythm:        Vascular    Pain in calf, thigh, or hip brought on by ambulation:    Pain in feet at night that wakes you up from your sleep:     Blood clot in your veins:    Leg swelling:         Pulmonary    Oxygen at home:    Productive cough:     Wheezing:         Neurologic    Sudden weakness in arms or legs:     Sudden numbness in arms or legs:     Sudden onset of difficulty speaking or slurred speech:    Temporary loss of vision in one eye:     Problems with dizziness:         Gastrointestinal    Blood in stool:     Vomited blood:         Genitourinary    Burning when urinating:     Blood in urine:        Psychiatric    Major depression:         Hematologic    Bleeding problems:    Problems with blood clotting too easily:        Skin    Rashes or ulcers:        Constitutional  Fever or chills:      PHYSICAL EXAMINATION:  There were no vitals filed for this  visit.  General:  WDWN in NAD; vital signs documented above Gait: Not observed HENT: WNL, normocephalic Pulmonary: normal non-labored breathing , without wheezing Cardiac: {Desc; regular/irreg:14544} HR Abdomen: soft, NT, no masses Skin: {With/Without:20273} rashes Vascular Exam/Pulses:  Right Left  Radial {Exam; arterial pulse strength 0-4:30167} {Exam; arterial pulse strength 0-4:30167}  Ulnar {Exam; arterial pulse strength 0-4:30167} {Exam; arterial pulse strength 0-4:30167}  Femoral {Exam; arterial pulse strength 0-4:30167} {Exam; arterial pulse strength 0-4:30167}  Popliteal {Exam; arterial pulse strength 0-4:30167} {Exam; arterial pulse strength 0-4:30167}  DP {Exam; arterial pulse strength 0-4:30167} {Exam; arterial pulse strength 0-4:30167}  PT {Exam; arterial pulse strength 0-4:30167} {Exam; arterial pulse strength 0-4:30167}   Extremities: {With/Without:20273} ischemic changes, {With/Without:20273} Gangrene , {With/Without:20273} cellulitis; {With/Without:20273} open wounds;  Musculoskeletal: no muscle wasting or atrophy  Neurologic: A&O X 3;  No focal weakness or paresthesias are detected Psychiatric:  The pt has {Desc; normal/abnormal:11317::Normal} affect.   Non-Invasive Vascular Imaging:   ***    ASSESSMENT/PLAN: Naithan Ory Elting is a 49 y.o. male presenting with ***   ***   Fonda FORBES Rim, MD [facility] [phone]     [1]  Allergies Allergen Reactions   Whole Blood     [facility] - declines blood or blood products, will accept fractions such as albumin ,hemoglobin   "

## 2024-03-05 ENCOUNTER — Ambulatory Visit: Admitting: Vascular Surgery

## 2024-03-11 NOTE — Progress Notes (Signed)
 " Office Note     CC: IVC filter removal Requesting Provider:  Melanee Suzen RAMAN, FNP  HPI: Kenneth Cross is a 49 y.o. (09-25-1975) male presenting at the request of .Gravely, Suzen RAMAN, FNP for consideration of IVC filter removal.  I saw the patient in the office last year.  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  On initial consultation in 2023, left popliteal artery thrombus was incidental.  He was sensorimotor intact with no rest pain.  He had a PE with a GI bleed and hemoglobin of 6.5.  He was [employer], and refusing blood products.  At that time we elected to watch his lower extremity which remained asymptomatic.  On exam today, [name] presented in a wheelchair.  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  He does very little exercise at home, and blames the cold weather.  States that coordination is the major issue with the lower extremities, and limits his ability to ambulate. Denies symptoms of claudications, ischemic rest pain, tissue loss.  Past Medical History:  Diagnosis Date   DVT (deep venous thrombosis) (HCC)    Seizures (HCC)     Past Surgical History:  Procedure Laterality Date   IR IVC FILTER PLMT / S&I /IMG GUID/MOD SED  05/19/2021    Social History   Socioeconomic History   Marital status: Single    Spouse name: Not on file   Number of children: Not on file   Years of education: Not on file   Highest education level: Not on file  Occupational History   Not on file  Tobacco Use   Smoking status: Never    Passive exposure: Never   Smokeless tobacco: Never  Vaping Use   Vaping status: Never Used  Substance and Sexual Activity   Alcohol use: Yes   Drug use: Never   Sexual activity: Never  Other Topics Concern    Not on file  Social History Narrative   Right handed   Live alone one story    Caffeine rarely   Social Drivers of Health   Tobacco Use: Low Risk (03/09/2024)   Received from [facility]   Patient History    Smoking Tobacco Use: Never    Smokeless Tobacco Use: Never    Passive Exposure: Not on file  Financial Resource Strain: Not on file  Food Insecurity: Not on file  Transportation Needs: Not on file  Physical Activity: Not on file  Stress: Not on file  Social Connections: Not on file  Intimate Partner Violence: Not on file  Depression (EYV7-0): Not on file  Alcohol Screen: Not on file  Housing: Not on file  Utilities: Not on file  Health Literacy: Not on file   Family History  Problem Relation Age of Onset   High blood pressure Father    Broken bones Father     Current Outpatient Medications  Medication Sig Dispense Refill   allopurinol  (ZYLOPRIM ) 300 MG tablet Take 300 mg by mouth daily.     apixaban  (ELIQUIS ) 5 MG TABS tablet Take 1 tablet (5 mg total) by mouth 2 (two) times daily. START ON 05/29/21, ONLY AFTER THE 10MG  DOSAGE HAS COMPLETED 60 tablet 1   ascorbic acid  (VITAMIN C) 500 MG tablet  Take 1 tablet (500 mg total) by mouth daily. 30 tablet 0   atorvastatin  (LIPITOR) 10 MG tablet Take 1 tablet (10 mg total) by mouth daily. 30 tablet 1   colchicine 0.6 MG tablet Take 0.6 mg by mouth daily as needed. (Patient not taking: Reported on 09/27/2023)     ferrous sulfate  325 (65 FE) MG tablet Take 1 tablet (325 mg total) by mouth 2 (two) times daily with a meal. 60 tablet 3   hydrochlorothiazide (HYDRODIURIL) 12.5 MG tablet Take 12.5 mg by mouth daily.     levETIRAcetam (KEPPRA) 750 MG tablet Take 750 mg by mouth 2 (two) times daily.     levocetirizine (XYZAL) 5 MG tablet Take 5 mg by mouth every evening.     levothyroxine  (SYNTHROID ) 25 MCG tablet Take 1 tablet (25 mcg total) by mouth daily. 30 tablet 1   mirtazapine  (REMERON ) 15 MG tablet Take 15  mg by mouth at bedtime as needed (sleep).     montelukast (SINGULAIR) 10 MG tablet Take 10 mg by mouth daily.     Multiple Vitamins-Minerals ([employer] SPECTRAVITE ULTRA MENS) TABS Take 1 tablet by mouth daily.     neomycin -bacitracin -polymyxin (NEOSPORIN) ointment Apply topically 2 (two) times daily. (Patient not taking: Reported on 09/27/2023) 15 g 0   oxcarbazepine  (TRILEPTAL ) 600 MG tablet Take 600 mg by mouth 2 (two) times daily.     oxyCODONE -acetaminophen  (PERCOCET) 7.5-325 MG tablet Take 1 tablet by mouth every 8 (eight) hours as needed for severe pain. 30 tablet 0   pantoprazole  (PROTONIX ) 40 MG tablet Take 1 tablet (40 mg total) by mouth daily at 12 noon. (Patient not taking: Reported on 09/27/2023) 30 tablet 1   polyethylene glycol (MIRALAX  / GLYCOLAX ) 17 g packet Take 17 g by mouth daily as needed for moderate constipation. 14 each 0   propranolol  (INDERAL ) 10 MG tablet Take 1 tablet (10 mg total) by mouth 2 (two) times daily. 60 tablet 1   No current facility-administered medications for this visit.    Allergies[1]   REVIEW OF SYSTEMS:  [X]  denotes positive finding, [ ]  denotes negative finding Cardiac  Comments:  Chest pain or chest pressure:    Shortness of breath upon exertion:    Short of breath when lying flat:    Irregular heart rhythm:        Vascular    Pain in calf, thigh, or hip brought on by ambulation:    Pain in feet at night that wakes you up from your sleep:     Blood clot in your veins:    Leg swelling:         Pulmonary    Oxygen at home:    Productive cough:     Wheezing:         Neurologic    Sudden weakness in arms or legs:     Sudden numbness in arms or legs:     Sudden onset of difficulty speaking or slurred speech:    Temporary loss of vision in one eye:     Problems with dizziness:         Gastrointestinal    Blood in stool:     Vomited blood:         Genitourinary    Burning when urinating:     Blood in urine:        Psychiatric    Major  depression:         Hematologic    Bleeding problems:  Problems with blood clotting too easily:        Skin    Rashes or ulcers:        Constitutional    Fever or chills:      PHYSICAL EXAMINATION:  There were no vitals filed for this visit.  General:  WDWN in NAD; vital signs documented above Gait: Not observed HENT: WNL, normocephalic Pulmonary: normal non-labored breathing , without wheezing Cardiac: regular HR Abdomen: soft, NT, no masses Skin: without rashes Vascular Exam/Pulses:  Right Left  Radial 2+ (normal) 2+ (normal)                       Extremities: without ischemic changes, without Gangrene , without cellulitis; without open wounds;  Musculoskeletal: no muscle wasting or atrophy  Neurologic: A&O X 3;  No focal weakness or paresthesias are detected Psychiatric:  The pt has Normal affect.   Non-Invasive Vascular Imaging:   none    ASSESSMENT/PLAN: Brennden Kaid Seeberger is a 49 y.o. male presenting in follow-up with inferior vena cava filter.  He would like the filter removed if possible.  Scott and I had a long discussion regarding his inferior vena cava filter.  Similar to his last visit, we discussed that filters are best managed within a 20-month time point.  He is now 3 years out of surgery.  The filter that he has appears to be a denial a vena cava filter, which can be removable.  At the time of its placement, the plan was for the filter to be permanent.  Scottie is tolerating anticoagulation, and has not had any issues.  We discussed that at 3 years, there is a chance that the filter has migrated into the walls of the inferior vena cava.  If this is the case, filter will be permanent.  Furthermore, we discussed that should he need future surgery with a hold of anticoagulation, the filter would be beneficial.  Terrian was adament that he would like the filter removed, therefore we discussed assessing the filter via CT scan prior to attempted  retrieval.  I will call him with the results of the study.    Fonda FORBES Rim, MD [facility] [phone]     [1]  Allergies Allergen Reactions   Whole Blood     [facility] - declines blood or blood products, will accept fractions such as albumin ,hemoglobin   "

## 2024-03-12 ENCOUNTER — Other Ambulatory Visit: Payer: Self-pay | Admitting: *Deleted

## 2024-03-12 ENCOUNTER — Encounter: Payer: Self-pay | Admitting: Vascular Surgery

## 2024-03-12 ENCOUNTER — Ambulatory Visit: Attending: Vascular Surgery | Admitting: Vascular Surgery

## 2024-03-12 VITALS — BP 109/73 | HR 84 | Temp 98.4°F | Resp 20 | Ht 70.0 in | Wt 169.0 lb

## 2024-03-12 DIAGNOSIS — Z95828 Presence of other vascular implants and grafts: Secondary | ICD-10-CM

## 2024-03-12 DIAGNOSIS — Z01818 Encounter for other preprocedural examination: Secondary | ICD-10-CM

## 2024-04-09 ENCOUNTER — Ambulatory Visit (HOSPITAL_COMMUNITY)

## 2024-04-09 ENCOUNTER — Other Ambulatory Visit (HOSPITAL_COMMUNITY)

## 2024-04-23 ENCOUNTER — Ambulatory Visit: Admitting: Vascular Surgery
